# Patient Record
Sex: Female | Born: 1935 | Race: Black or African American | Hispanic: No | Smoking: Never smoker
Health system: Southern US, Community
[De-identification: ages and names within clinical notes are randomized; demographics above are authoritative.]

## PROBLEM LIST (undated history)

## (undated) DIAGNOSIS — M1990 Unspecified osteoarthritis, unspecified site: Secondary | ICD-10-CM

## (undated) DIAGNOSIS — F32A Depression, unspecified: Secondary | ICD-10-CM

## (undated) DIAGNOSIS — K219 Gastro-esophageal reflux disease without esophagitis: Secondary | ICD-10-CM

## (undated) DIAGNOSIS — E785 Hyperlipidemia, unspecified: Secondary | ICD-10-CM

## (undated) DIAGNOSIS — F0280 Dementia in other diseases classified elsewhere without behavioral disturbance: Secondary | ICD-10-CM

## (undated) DIAGNOSIS — F329 Major depressive disorder, single episode, unspecified: Secondary | ICD-10-CM

## (undated) DIAGNOSIS — F419 Anxiety disorder, unspecified: Secondary | ICD-10-CM

## (undated) DIAGNOSIS — I10 Essential (primary) hypertension: Secondary | ICD-10-CM

## (undated) DIAGNOSIS — I499 Cardiac arrhythmia, unspecified: Secondary | ICD-10-CM

## (undated) DIAGNOSIS — R011 Cardiac murmur, unspecified: Secondary | ICD-10-CM

## (undated) DIAGNOSIS — G309 Alzheimer's disease, unspecified: Secondary | ICD-10-CM

## (undated) HISTORY — DX: Cardiac arrhythmia, unspecified: I49.9

## (undated) HISTORY — PX: FOOT FRACTURE SURGERY: SHX645

## (undated) HISTORY — PX: REPLACEMENT TOTAL KNEE: SUR1224

## (undated) HISTORY — DX: Cardiac murmur, unspecified: R01.1

## (undated) HISTORY — DX: Essential (primary) hypertension: I10

## (undated) HISTORY — PX: EYE SURGERY: SHX253

## (undated) HISTORY — DX: Hyperlipidemia, unspecified: E78.5

## (undated) HISTORY — PX: HIP SURGERY: SHX245

## (undated) HISTORY — DX: Gastro-esophageal reflux disease without esophagitis: K21.9

## (undated) HISTORY — DX: Depression, unspecified: F32.A

## (undated) HISTORY — PX: SHOULDER ARTHROSCOPY: SHX128

## (undated) HISTORY — DX: Major depressive disorder, single episode, unspecified: F32.9

## (undated) HISTORY — DX: Anxiety disorder, unspecified: F41.9

## (undated) HISTORY — PX: JOINT REPLACEMENT: SHX530

## (undated) HISTORY — DX: Unspecified osteoarthritis, unspecified site: M19.90

## (undated) HISTORY — PX: FRACTURE SURGERY: SHX138

---

## 1998-12-23 ENCOUNTER — Other Ambulatory Visit: Admission: RE | Admit: 1998-12-23 | Discharge: 1998-12-23 | Payer: Self-pay | Admitting: Family Medicine

## 1999-01-11 ENCOUNTER — Encounter: Payer: Self-pay | Admitting: Family Medicine

## 1999-01-11 ENCOUNTER — Ambulatory Visit (HOSPITAL_COMMUNITY): Admission: RE | Admit: 1999-01-11 | Discharge: 1999-01-11 | Payer: Self-pay | Admitting: Family Medicine

## 2000-01-13 ENCOUNTER — Ambulatory Visit (HOSPITAL_COMMUNITY): Admission: RE | Admit: 2000-01-13 | Discharge: 2000-01-13 | Payer: Self-pay | Admitting: *Deleted

## 2000-02-15 ENCOUNTER — Other Ambulatory Visit: Admission: RE | Admit: 2000-02-15 | Discharge: 2000-02-15 | Payer: Self-pay | Admitting: Family Medicine

## 2000-10-08 ENCOUNTER — Encounter: Admission: RE | Admit: 2000-10-08 | Discharge: 2000-10-08 | Payer: Self-pay | Admitting: Family Medicine

## 2000-10-08 ENCOUNTER — Encounter: Payer: Self-pay | Admitting: Family Medicine

## 2001-03-21 ENCOUNTER — Emergency Department (HOSPITAL_COMMUNITY): Admission: EM | Admit: 2001-03-21 | Discharge: 2001-03-21 | Payer: Self-pay | Admitting: Emergency Medicine

## 2001-04-22 ENCOUNTER — Encounter: Admission: RE | Admit: 2001-04-22 | Discharge: 2001-04-22 | Payer: Self-pay | Admitting: Otolaryngology

## 2001-04-22 ENCOUNTER — Encounter: Payer: Self-pay | Admitting: Otolaryngology

## 2001-05-21 ENCOUNTER — Ambulatory Visit (HOSPITAL_BASED_OUTPATIENT_CLINIC_OR_DEPARTMENT_OTHER): Admission: RE | Admit: 2001-05-21 | Discharge: 2001-05-21 | Payer: Self-pay | Admitting: Otolaryngology

## 2001-08-27 ENCOUNTER — Other Ambulatory Visit: Admission: RE | Admit: 2001-08-27 | Discharge: 2001-08-27 | Payer: Self-pay | Admitting: Family Medicine

## 2001-10-14 ENCOUNTER — Ambulatory Visit (HOSPITAL_COMMUNITY): Admission: RE | Admit: 2001-10-14 | Discharge: 2001-10-14 | Payer: Self-pay | Admitting: Family Medicine

## 2002-10-16 ENCOUNTER — Emergency Department (HOSPITAL_COMMUNITY): Admission: EM | Admit: 2002-10-16 | Discharge: 2002-10-16 | Payer: Self-pay | Admitting: Emergency Medicine

## 2002-12-30 ENCOUNTER — Ambulatory Visit (HOSPITAL_COMMUNITY): Admission: RE | Admit: 2002-12-30 | Discharge: 2002-12-30 | Payer: Self-pay | Admitting: Family Medicine

## 2002-12-30 ENCOUNTER — Encounter: Payer: Self-pay | Admitting: Family Medicine

## 2003-01-21 ENCOUNTER — Ambulatory Visit (HOSPITAL_COMMUNITY): Admission: RE | Admit: 2003-01-21 | Discharge: 2003-01-21 | Payer: Self-pay | Admitting: Family Medicine

## 2003-01-21 ENCOUNTER — Encounter: Payer: Self-pay | Admitting: Family Medicine

## 2003-02-27 ENCOUNTER — Encounter: Payer: Self-pay | Admitting: Family Medicine

## 2003-02-27 ENCOUNTER — Ambulatory Visit (HOSPITAL_COMMUNITY): Admission: RE | Admit: 2003-02-27 | Discharge: 2003-02-27 | Payer: Self-pay | Admitting: Family Medicine

## 2003-04-02 ENCOUNTER — Encounter: Payer: Self-pay | Admitting: Orthopedic Surgery

## 2003-04-02 ENCOUNTER — Encounter: Payer: Self-pay | Admitting: Radiology

## 2003-04-02 ENCOUNTER — Encounter: Admission: RE | Admit: 2003-04-02 | Discharge: 2003-04-02 | Payer: Self-pay | Admitting: Orthopedic Surgery

## 2003-04-15 ENCOUNTER — Encounter: Payer: Self-pay | Admitting: Orthopedic Surgery

## 2003-04-15 ENCOUNTER — Encounter: Admission: RE | Admit: 2003-04-15 | Discharge: 2003-04-15 | Payer: Self-pay | Admitting: Orthopedic Surgery

## 2003-07-03 ENCOUNTER — Ambulatory Visit (HOSPITAL_COMMUNITY): Admission: RE | Admit: 2003-07-03 | Discharge: 2003-07-03 | Payer: Self-pay | Admitting: Orthopedic Surgery

## 2003-07-18 ENCOUNTER — Emergency Department (HOSPITAL_COMMUNITY): Admission: AD | Admit: 2003-07-18 | Discharge: 2003-07-18 | Payer: Self-pay | Admitting: Family Medicine

## 2003-09-14 ENCOUNTER — Emergency Department (HOSPITAL_COMMUNITY): Admission: EM | Admit: 2003-09-14 | Discharge: 2003-09-14 | Payer: Self-pay | Admitting: Family Medicine

## 2003-10-15 ENCOUNTER — Ambulatory Visit (HOSPITAL_COMMUNITY): Admission: RE | Admit: 2003-10-15 | Discharge: 2003-10-15 | Payer: Self-pay | Admitting: Family Medicine

## 2005-11-08 ENCOUNTER — Ambulatory Visit: Payer: Self-pay | Admitting: Family Medicine

## 2005-11-22 ENCOUNTER — Encounter: Admission: RE | Admit: 2005-11-22 | Discharge: 2005-11-22 | Payer: Self-pay | Admitting: Orthopedic Surgery

## 2005-11-28 ENCOUNTER — Encounter: Admission: RE | Admit: 2005-11-28 | Discharge: 2005-11-28 | Payer: Self-pay | Admitting: Orthopedic Surgery

## 2005-12-20 ENCOUNTER — Ambulatory Visit: Payer: Self-pay | Admitting: Family Medicine

## 2006-02-14 ENCOUNTER — Ambulatory Visit: Payer: Self-pay | Admitting: Family Medicine

## 2006-04-09 ENCOUNTER — Ambulatory Visit: Payer: Self-pay | Admitting: Family Medicine

## 2006-04-26 ENCOUNTER — Ambulatory Visit (HOSPITAL_COMMUNITY): Admission: RE | Admit: 2006-04-26 | Discharge: 2006-04-26 | Payer: Self-pay | Admitting: Family Medicine

## 2006-05-30 ENCOUNTER — Ambulatory Visit: Payer: Self-pay | Admitting: Internal Medicine

## 2006-08-15 ENCOUNTER — Ambulatory Visit: Payer: Self-pay | Admitting: Family Medicine

## 2006-08-25 ENCOUNTER — Emergency Department (HOSPITAL_COMMUNITY): Admission: EM | Admit: 2006-08-25 | Discharge: 2006-08-25 | Payer: Self-pay | Admitting: Family Medicine

## 2006-09-20 ENCOUNTER — Ambulatory Visit: Payer: Self-pay | Admitting: Family Medicine

## 2006-09-21 ENCOUNTER — Encounter: Admission: RE | Admit: 2006-09-21 | Discharge: 2006-09-21 | Payer: Self-pay | Admitting: Family Medicine

## 2006-11-23 ENCOUNTER — Encounter: Admission: RE | Admit: 2006-11-23 | Discharge: 2006-11-23 | Payer: Self-pay | Admitting: Family Medicine

## 2007-01-25 ENCOUNTER — Ambulatory Visit: Payer: Self-pay | Admitting: Family Medicine

## 2007-02-05 ENCOUNTER — Ambulatory Visit (HOSPITAL_COMMUNITY): Admission: RE | Admit: 2007-02-05 | Discharge: 2007-02-05 | Payer: Self-pay | Admitting: Family Medicine

## 2007-03-06 ENCOUNTER — Ambulatory Visit: Payer: Self-pay | Admitting: Family Medicine

## 2007-03-15 ENCOUNTER — Ambulatory Visit: Payer: Self-pay | Admitting: Cardiovascular Disease

## 2007-03-19 ENCOUNTER — Inpatient Hospital Stay (HOSPITAL_COMMUNITY): Admission: RE | Admit: 2007-03-19 | Discharge: 2007-03-23 | Payer: Self-pay | Admitting: Specialist

## 2007-05-24 ENCOUNTER — Ambulatory Visit: Payer: Self-pay | Admitting: Internal Medicine

## 2007-05-24 ENCOUNTER — Ambulatory Visit (HOSPITAL_COMMUNITY): Admission: RE | Admit: 2007-05-24 | Discharge: 2007-05-24 | Payer: Self-pay | Admitting: Family Medicine

## 2007-09-02 ENCOUNTER — Ambulatory Visit: Payer: Self-pay | Admitting: Family Medicine

## 2007-09-02 LAB — CONVERTED CEMR LAB
BUN: 26 mg/dL — ABNORMAL HIGH (ref 6–23)
Basophils Absolute: 0 10*3/uL (ref 0.0–0.1)
Basophils Relative: 0 % (ref 0–1)
Chloride: 100 meq/L (ref 96–112)
Creatinine, Ser: 1.27 mg/dL — ABNORMAL HIGH (ref 0.40–1.20)
Eosinophils Absolute: 0.2 10*3/uL (ref 0.0–0.7)
Eosinophils Relative: 2 % (ref 0–5)
Free T4: 1.22 ng/dL (ref 0.89–1.80)
Lymphocytes Relative: 22 % (ref 12–46)
MCHC: 33.8 g/dL (ref 30.0–36.0)
Monocytes Absolute: 0.6 10*3/uL (ref 0.1–1.0)
Platelets: 168 10*3/uL (ref 150–400)
Sodium: 139 meq/L (ref 135–145)

## 2007-09-13 ENCOUNTER — Ambulatory Visit (HOSPITAL_COMMUNITY): Admission: RE | Admit: 2007-09-13 | Discharge: 2007-09-13 | Payer: Self-pay | Admitting: Family Medicine

## 2007-09-19 ENCOUNTER — Emergency Department (HOSPITAL_COMMUNITY): Admission: EM | Admit: 2007-09-19 | Discharge: 2007-09-20 | Payer: Self-pay | Admitting: Emergency Medicine

## 2007-09-24 ENCOUNTER — Ambulatory Visit: Payer: Self-pay | Admitting: Family Medicine

## 2007-10-15 ENCOUNTER — Encounter (INDEPENDENT_AMBULATORY_CARE_PROVIDER_SITE_OTHER): Payer: Self-pay | Admitting: Family Medicine

## 2007-10-15 ENCOUNTER — Ambulatory Visit: Payer: Self-pay | Admitting: Internal Medicine

## 2007-10-15 LAB — CONVERTED CEMR LAB
BUN: 30 mg/dL — ABNORMAL HIGH
CO2: 28 meq/L
Calcium: 9.9 mg/dL
Chloride: 102 meq/L
Creatinine, Ser: 1.67 mg/dL — ABNORMAL HIGH
Glucose, Bld: 90 mg/dL
Potassium: 3.2 meq/L — ABNORMAL LOW
Sodium: 140 meq/L

## 2007-11-01 ENCOUNTER — Ambulatory Visit: Payer: Self-pay | Admitting: Internal Medicine

## 2007-11-01 ENCOUNTER — Encounter (INDEPENDENT_AMBULATORY_CARE_PROVIDER_SITE_OTHER): Payer: Self-pay | Admitting: Family Medicine

## 2007-11-01 LAB — CONVERTED CEMR LAB
Calcium: 9.9 mg/dL (ref 8.4–10.5)
Chloride: 103 meq/L (ref 96–112)

## 2008-03-12 ENCOUNTER — Emergency Department (HOSPITAL_COMMUNITY): Admission: EM | Admit: 2008-03-12 | Discharge: 2008-03-13 | Payer: Self-pay | Admitting: Emergency Medicine

## 2008-03-17 ENCOUNTER — Ambulatory Visit: Payer: Self-pay | Admitting: Family Medicine

## 2008-03-17 LAB — CONVERTED CEMR LAB: Sodium: 141 meq/L (ref 135–145)

## 2008-03-25 ENCOUNTER — Ambulatory Visit (HOSPITAL_COMMUNITY): Admission: RE | Admit: 2008-03-25 | Discharge: 2008-03-25 | Payer: Self-pay | Admitting: Family Medicine

## 2008-04-06 ENCOUNTER — Ambulatory Visit: Payer: Self-pay | Admitting: Family Medicine

## 2008-04-06 LAB — CONVERTED CEMR LAB
Calcium: 10.5 mg/dL (ref 8.4–10.5)
Glucose, Bld: 106 mg/dL — ABNORMAL HIGH (ref 70–99)
Potassium: 3.3 meq/L — ABNORMAL LOW (ref 3.5–5.3)

## 2008-05-11 ENCOUNTER — Ambulatory Visit: Payer: Self-pay | Admitting: Family Medicine

## 2008-05-25 ENCOUNTER — Ambulatory Visit (HOSPITAL_COMMUNITY): Admission: RE | Admit: 2008-05-25 | Discharge: 2008-05-25 | Payer: Self-pay | Admitting: Family Medicine

## 2008-07-24 ENCOUNTER — Ambulatory Visit: Payer: Self-pay | Admitting: Family Medicine

## 2008-07-24 LAB — CONVERTED CEMR LAB
BUN: 15 mg/dL (ref 6–23)
Basophils Relative: 0 % (ref 0–1)
Calcium: 10 mg/dL (ref 8.4–10.5)
Creatinine, Ser: 1.05 mg/dL (ref 0.40–1.20)
Eosinophils Relative: 1 % (ref 0–5)
Lymphocytes Relative: 25 % (ref 12–46)
MCV: 88.3 fL (ref 78.0–100.0)
Monocytes Relative: 8 % (ref 3–12)
Neutrophils Relative %: 66 % (ref 43–77)
Platelets: 174 10*3/uL (ref 150–400)
Potassium: 3.8 meq/L (ref 3.5–5.3)
RBC: 4.36 M/uL (ref 3.87–5.11)
RDW: 14.7 % (ref 11.5–15.5)
Sodium: 142 meq/L (ref 135–145)
Vit D, 1,25-Dihydroxy: 29 — ABNORMAL LOW (ref 30–89)
WBC: 6.4 10*3/uL (ref 4.0–10.5)

## 2008-08-06 ENCOUNTER — Ambulatory Visit: Payer: Self-pay | Admitting: Family Medicine

## 2008-09-29 ENCOUNTER — Ambulatory Visit (HOSPITAL_COMMUNITY): Admission: RE | Admit: 2008-09-29 | Discharge: 2008-09-30 | Payer: Self-pay | Admitting: Specialist

## 2008-10-28 ENCOUNTER — Emergency Department (HOSPITAL_COMMUNITY): Admission: EM | Admit: 2008-10-28 | Discharge: 2008-10-29 | Payer: Self-pay | Admitting: Emergency Medicine

## 2008-10-29 ENCOUNTER — Encounter (INDEPENDENT_AMBULATORY_CARE_PROVIDER_SITE_OTHER): Payer: Self-pay | Admitting: Emergency Medicine

## 2008-10-29 ENCOUNTER — Ambulatory Visit: Payer: Self-pay | Admitting: Vascular Surgery

## 2008-11-05 ENCOUNTER — Ambulatory Visit: Payer: Self-pay | Admitting: Family Medicine

## 2008-12-01 ENCOUNTER — Emergency Department (HOSPITAL_COMMUNITY): Admission: EM | Admit: 2008-12-01 | Discharge: 2008-12-01 | Payer: Self-pay | Admitting: Emergency Medicine

## 2008-12-04 ENCOUNTER — Encounter: Admission: RE | Admit: 2008-12-04 | Discharge: 2009-01-21 | Payer: Self-pay | Admitting: Specialist

## 2009-02-04 ENCOUNTER — Ambulatory Visit: Payer: Self-pay | Admitting: Family Medicine

## 2009-05-15 ENCOUNTER — Emergency Department (HOSPITAL_COMMUNITY): Admission: EM | Admit: 2009-05-15 | Discharge: 2009-05-15 | Payer: Self-pay | Admitting: Emergency Medicine

## 2009-05-17 ENCOUNTER — Telehealth (INDEPENDENT_AMBULATORY_CARE_PROVIDER_SITE_OTHER): Payer: Self-pay | Admitting: *Deleted

## 2009-05-23 ENCOUNTER — Emergency Department (HOSPITAL_COMMUNITY): Admission: EM | Admit: 2009-05-23 | Discharge: 2009-05-23 | Payer: Self-pay | Admitting: Emergency Medicine

## 2009-05-24 ENCOUNTER — Ambulatory Visit: Payer: Self-pay | Admitting: Family Medicine

## 2009-05-24 ENCOUNTER — Encounter (INDEPENDENT_AMBULATORY_CARE_PROVIDER_SITE_OTHER): Payer: Self-pay | Admitting: Family Medicine

## 2009-05-24 LAB — CONVERTED CEMR LAB: TSH: 1.093 microintl units/mL (ref 0.350–4.500)

## 2009-05-27 ENCOUNTER — Ambulatory Visit (HOSPITAL_COMMUNITY): Admission: RE | Admit: 2009-05-27 | Discharge: 2009-05-27 | Payer: Self-pay | Admitting: Family Medicine

## 2009-06-14 IMAGING — CR DG CHEST 2V
2 series · 2 of 2 positions shown · non-contrast
Comparison: none

CLINICAL DATA: Difficulty breathing.  Complains of trouble swallowing.  Hypertension.
 CHEST - 2 VIEW - 02/05/07: 
 There are no prior studies currently available for comparison.

[w chest pa]
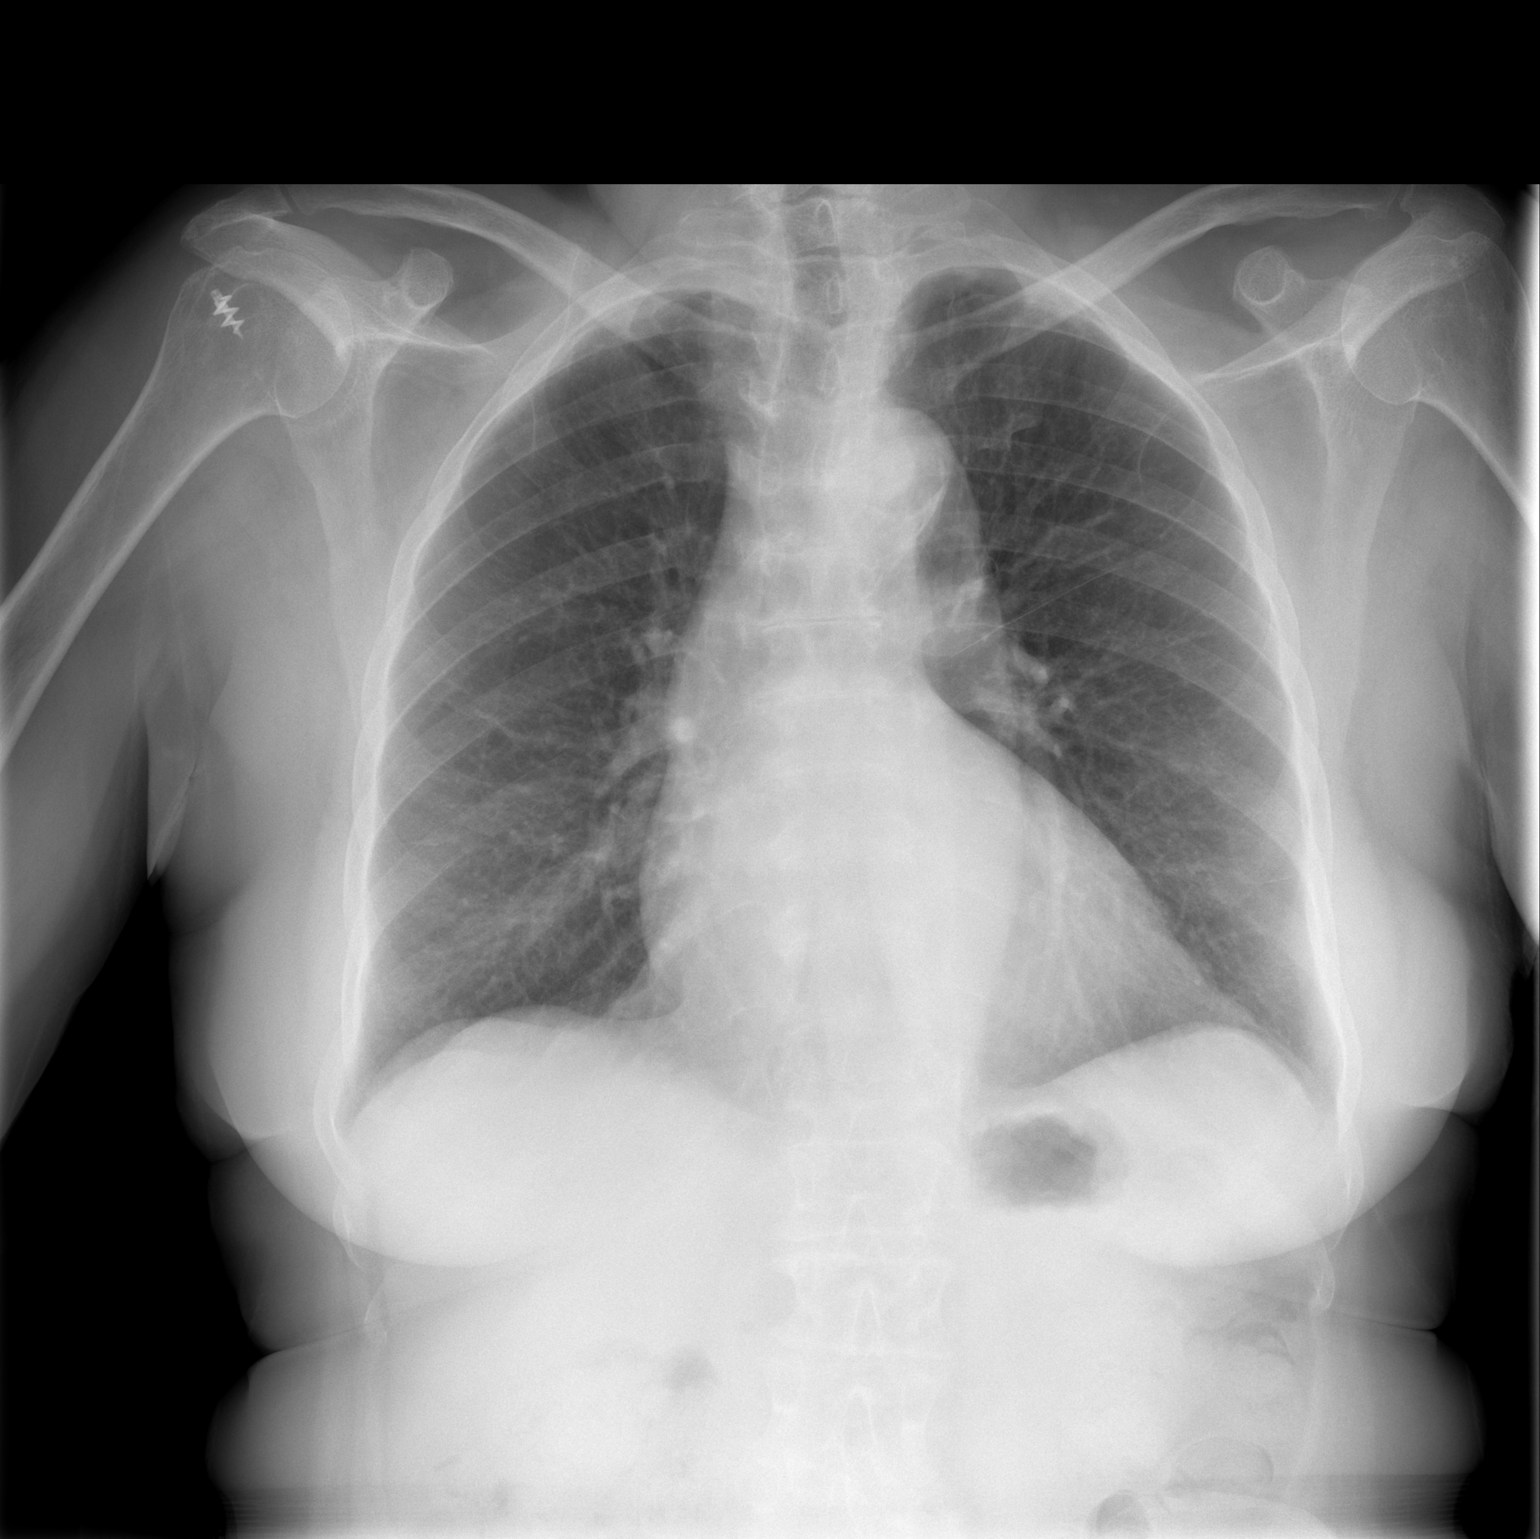

[w chest lat]
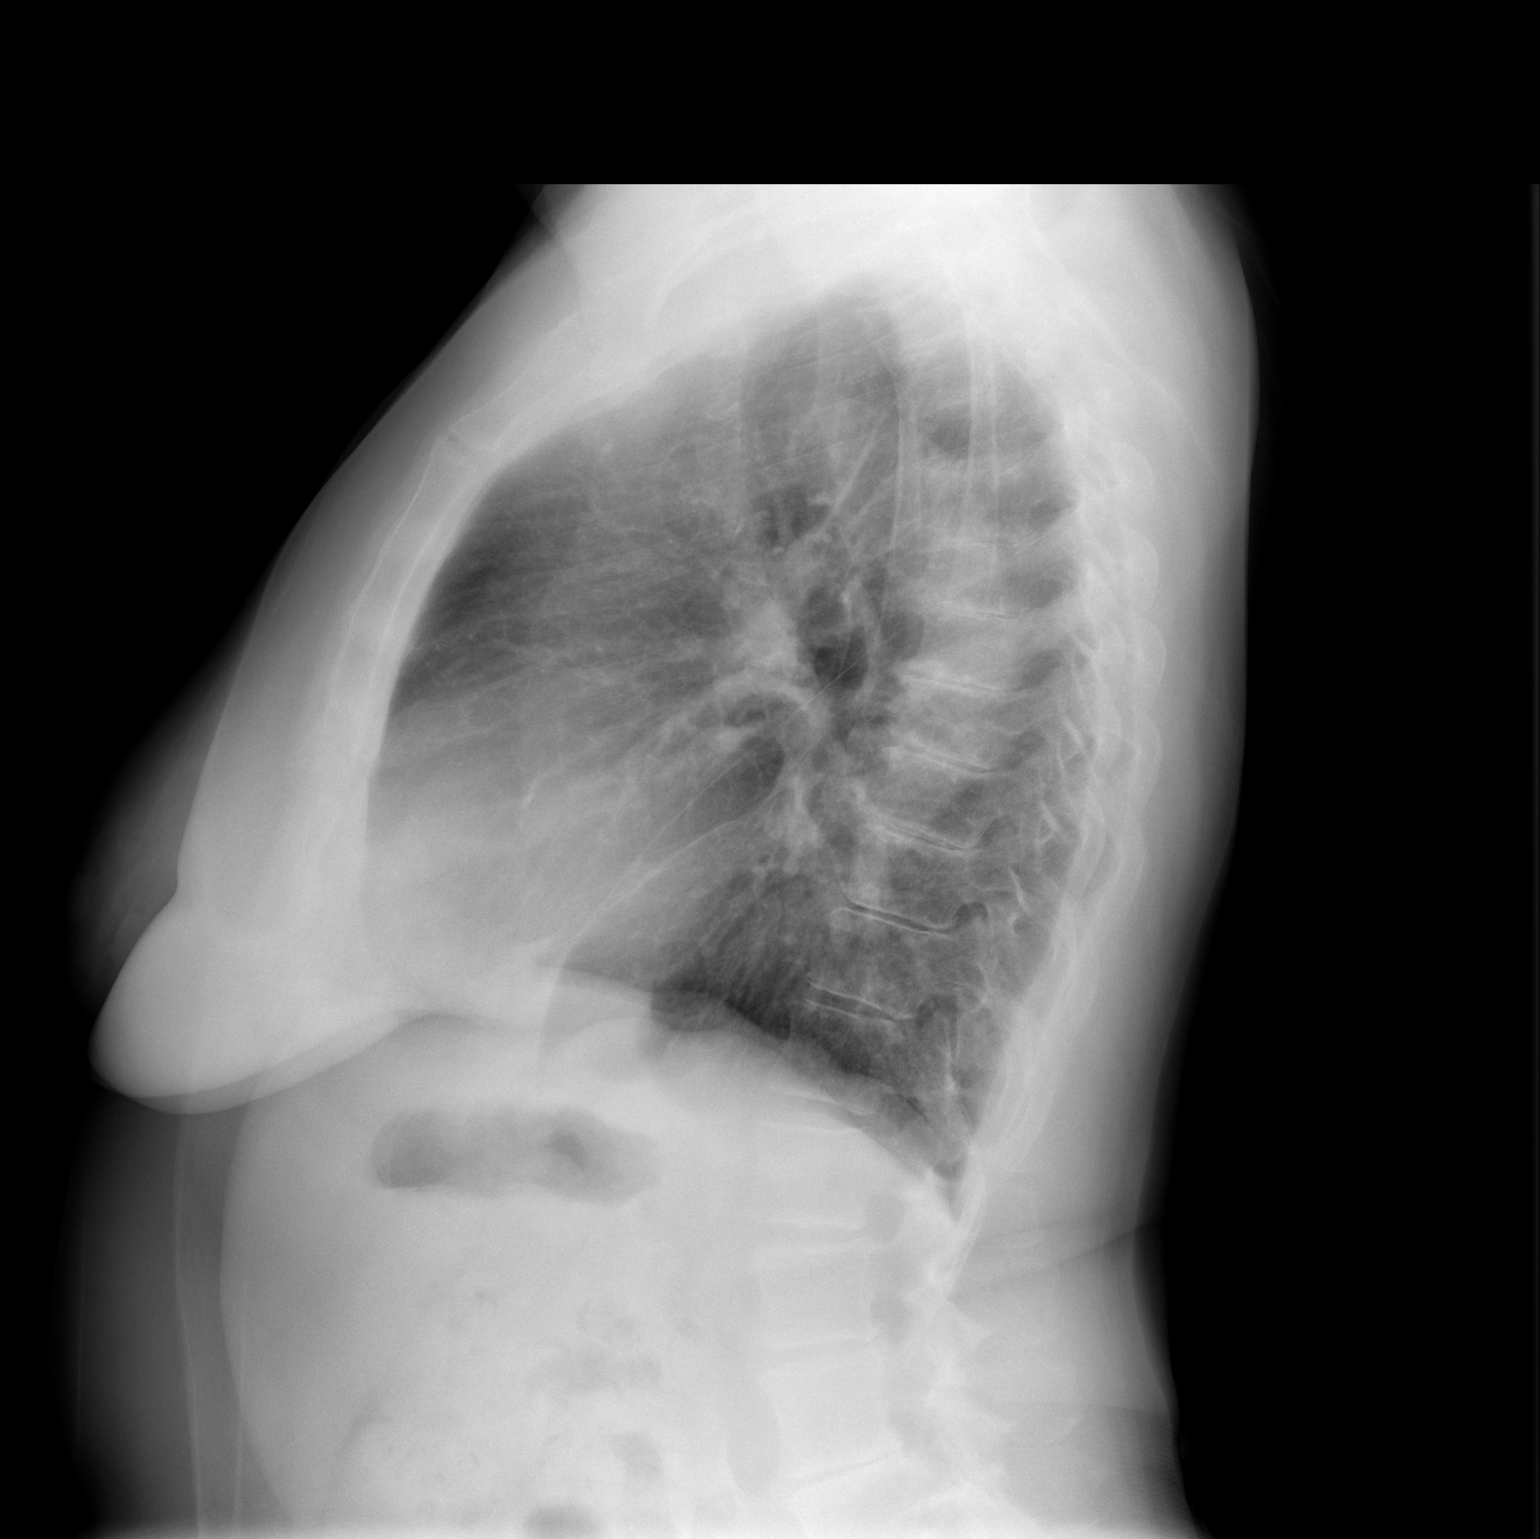

[2 of 2 positions shown; findings below may reference images not displayed]

FINDINGS: The lungs are clear.   The heart is borderline in size.  There are no mediastinal or hilar abnormalities.
IMPRESSION: Borderline cardiac size.  No evidence for active chest disease.

## 2009-07-01 ENCOUNTER — Ambulatory Visit: Payer: Self-pay | Admitting: Family Medicine

## 2009-07-01 LAB — CONVERTED CEMR LAB
AST: 15 units/L (ref 0–37)
Albumin: 3.9 g/dL (ref 3.5–5.2)
Alkaline Phosphatase: 77 units/L (ref 39–117)
Glucose, Bld: 59 mg/dL — ABNORMAL LOW (ref 70–99)
LDL Cholesterol: 82 mg/dL (ref 0–99)
Potassium: 3.7 meq/L (ref 3.5–5.3)
Sodium: 143 meq/L (ref 135–145)
Total Protein: 7.2 g/dL (ref 6.0–8.3)

## 2009-08-26 ENCOUNTER — Ambulatory Visit: Payer: Self-pay | Admitting: Family Medicine

## 2009-10-28 ENCOUNTER — Encounter: Payer: Self-pay | Admitting: Cardiovascular Disease

## 2009-12-02 ENCOUNTER — Encounter: Payer: Self-pay | Admitting: Cardiovascular Disease

## 2009-12-02 ENCOUNTER — Ambulatory Visit: Payer: Self-pay | Admitting: Family Medicine

## 2009-12-16 ENCOUNTER — Encounter (INDEPENDENT_AMBULATORY_CARE_PROVIDER_SITE_OTHER): Payer: Self-pay | Admitting: *Deleted

## 2009-12-17 ENCOUNTER — Encounter: Payer: Self-pay | Admitting: Cardiovascular Disease

## 2009-12-21 ENCOUNTER — Encounter: Payer: Self-pay | Admitting: Cardiovascular Disease

## 2009-12-30 ENCOUNTER — Ambulatory Visit: Payer: Self-pay | Admitting: Cardiovascular Disease

## 2009-12-30 DIAGNOSIS — R9431 Abnormal electrocardiogram [ECG] [EKG]: Secondary | ICD-10-CM

## 2009-12-30 DIAGNOSIS — I10 Essential (primary) hypertension: Secondary | ICD-10-CM | POA: Insufficient documentation

## 2009-12-30 DIAGNOSIS — E782 Mixed hyperlipidemia: Secondary | ICD-10-CM | POA: Insufficient documentation

## 2010-01-25 ENCOUNTER — Inpatient Hospital Stay (HOSPITAL_COMMUNITY): Admission: RE | Admit: 2010-01-25 | Discharge: 2010-01-29 | Payer: Self-pay | Admitting: Specialist

## 2010-03-12 ENCOUNTER — Emergency Department (HOSPITAL_COMMUNITY): Admission: EM | Admit: 2010-03-12 | Discharge: 2010-03-12 | Payer: Self-pay | Admitting: Family Medicine

## 2010-03-12 ENCOUNTER — Emergency Department (HOSPITAL_COMMUNITY): Admission: EM | Admit: 2010-03-12 | Discharge: 2010-03-12 | Payer: Self-pay | Admitting: Emergency Medicine

## 2010-08-16 NOTE — Consult Note (Signed)
Summary: [facility] Pre- Operative Consultation  [facility] Pre- Operative Consultation   Imported By: Earl Many 12/29/2009 11:10:27  _____________________________________________________________________  External Attachment:    Type:   Image     Comment:   External Document

## 2010-08-16 NOTE — Letter (Signed)
Summary: Appointment - Reschedule  [facility], Main Office  [address]   [place], [place] [address]   Phone: [phone]  Fax: [phone]     December 16, 2009 MRN: [id number]   Brenda Werner [address] [place], [place]  [address]   Dear Ms. Hoelzer,   Due to a change in our office schedule, your appointment on 12/21/2009 at  8:45am must be changed.  It is very important that we reach you to reschedule this appointment. We look forward to participating in your health care needs. Please contact us at the number listed above at your earliest convenience to reschedule this appointment.     Sincerely,   Migdalia Dk [facility] Scheduling Team

## 2010-08-16 NOTE — Assessment & Plan Note (Signed)
Summary: f2y/surgical clearance/jml   History of Present Illness: Elderly female with HTN, elevated lipids who needs preop clearance for orthopedic surgery.  Has had multiple previous ortho procedures with no complications.  Previous ECG with low atrial focus and nonspecific ST/ Twave changes.  No sscp, palpitations, dyspnea, edema or syncope.  Compliant with medications.  No chronic lung disease, blleding diathesis or previous problems with anesthesia.  BP has been under good control and LDL under 100.  No previously documented CAD, valvular disease, CHF or arrhythmia.  Current Problems (verified): 1)  Mixed Hyperlipidemia  (ICD-272.2) 2)  Essential Hypertension, Benign  (ICD-401.1) 3)  Electrocardiogram, Abnormal  (ICD-794.31) 4)  Preoperative Examination  (ICD-V72.84)  Current Medications (verified): 1)  Paxil 20 Mg Tabs (Paroxetine Hcl) .Marland Kitchen.. 1 Tab By Mouth Once Daily 2)  Allergy Relief 10 Mg Tabs (Loratadine) .... As Needed 3)  Gabapentin 100 Mg Caps (Gabapentin) .Marland Kitchen.. 1 Tab By Mouth Three Times A Day 4)  Etodolac 400 Mg Tabs (Etodolac) .Marland Kitchen.. 1 Tab By Mouth Two Times A Day With Food As Needed 5)  Aspirin 81 Mg Tbec (Aspirin) .... Take One Tablet By Mouth Daily 6)  Klor-Con 10 10 Meq Cr-Tabs (Potassium Chloride) .Marland Kitchen.. 1 Tab By Mouth Once Daily 7)  Triamterene-Hctz 75-50 Mg Tabs (Triamterene-Hctz) .Marland Kitchen.. 1 Tab By Mouth Once Daily 8)  Tylenol Arthritis Pain 650 Mg Cr-Tabs (Acetaminophen) .... As Needed 9)  Lipitor 20 Mg Tabs (Atorvastatin Calcium) .... Take One Tablet By Mouth Daily. 10)  Cartia Xt 240 Mg Xr24h-Cap (Diltiazem Hcl Coated Beads) .Marland Kitchen.. 1 Tab By Mouth Once Daily  Allergies (verified): No Known Drug Allergies  Past History:  Past Medical History: Last updated: 12/17/2009 The patient has a low atrial focus. hypertension arthritis  heart disease depression  Mild lower extremity edema Hypercholesterolemia.   Past Surgical History: Last updated: 12/17/2009 Traumatic left  ankle injury requiring surgery back in the 1980s.  Family History: Last updated: 12/17/2009  Noncontributory.  Social History: Last updated: 12/30/2009 Tobacco Use - No.  ETOH no Sedentary due to multiple ortho issues Ambulates with cane Does all ADL's but doesnt drive Lives with daughter  Social History: Tobacco Use - No.  ETOH no Sedentary due to multiple ortho issues Ambulates with cane Does all ADL's but doesnt drive Lives with daughter  Review of Systems       Denies fever, malais, weight loss, blurry vision, decreased visual acuity, cough, sputum, SOB, hemoptysis, pleuritic pain, palpitaitons, heartburn, abdominal pain, melena, lower extremity edema, claudication, or rash.   Vital Signs:  Patient profile:   77 year old female Pulse rate:   81 / minute Resp:     12 per minute BP sitting:   142 / 80  (left arm)  Vitals Entered By: Kem Parkinson (December 30, 2009 9:02 AM)  Physical Exam  General:  Affect appropriate Healthy:  appears stated age HEENT: normal Neck supple with no adenopathy JVP normal no bruits no thyromegaly Lungs clear with no wheezing and good diaphragmatic motion Heart:  S1/S2 no murmur,rub, gallop or click PMI normal Abdomen: benighn, BS positve, no tenderness, no AAA no bruit.  No HSM or HJR Distal pulses intact with no bruits No edema Neuro non-focal Skin warm and dry S/P right shoulder surgery S/P LLE surgery S/P left hip surgery   Impression & Recommendations:  Problem # 1:  PREOPERATIVE EXAMINATION (ICD-V72.84) No symptoms with no complications on multiple previous orthopedic procedures.  Nonsmoker and non diabetic.  Ok to have surgery without  further testing  Problem # 2:  ELECTROCARDIOGRAM, ABNORMAL (ICD-794.31) ECG today is low risk.  Previous low atrial focus seen in 2008 gone.  QT 392 No acute ST/T wave changes and no previous MI Her updated medication list for this problem includes:    Aspirin 81 Mg Tbec (Aspirin)  .Marland Kitchen... Take one tablet by mouth daily    Cartia Xt 240 Mg Xr24h-cap (Diltiazem hcl coated beads) .Marland Kitchen... 1 tab by mouth once daily  Problem # 3:  MIXED HYPERLIPIDEMIA (ICD-272.2) At goal with no side effects Her updated medication list for this problem includes:    Lipitor 20 Mg Tabs (Atorvastatin calcium) .Marland Kitchen... Take one tablet by mouth daily.  CHOL: 178 (07/01/2009)   LDL: 82 (07/01/2009)   HDL: 80 (07/01/2009)   TG: 78 (07/01/2009)  Problem # 4:  ESSENTIAL HYPERTENSION, BENIGN (ICD-401.1) Continue current medication.  Consider changing over to ACE Her updated medication list for this problem includes:    Aspirin 81 Mg Tbec (Aspirin) .Marland Kitchen... Take one tablet by mouth daily    Triamterene-hctz 75-50 Mg Tabs (Triamterene-hctz) .Marland Kitchen... 1 tab by mouth once daily    Cartia Xt 240 Mg Xr24h-cap (Diltiazem hcl coated beads) .Marland Kitchen... 1 tab by mouth once daily  Patient Instructions: 1)  Your physician recommends that you schedule a follow-up appointment in: as needed with Dr. Eden Emms 2)  Your physician recommends that you continue on your current medications as directed. Please refer to the Current Medication list given to you today.   EKG Report  Procedure date:  12/30/2009  Findings:      NSR 80 Low voltage QRS Borderline ECG

## 2010-08-16 NOTE — Consult Note (Signed)
Summary: [facility]   Imported By: Earl Many 12/29/2009 11:12:40  _____________________________________________________________________  External Attachment:    Type:   Image     Comment:   External Document

## 2010-08-16 NOTE — Consult Note (Signed)
Summary: [facility] Pre Op Consult   [facility] Pre Op Consult   Imported By: Roderic Ovens 02/14/2010 11:05:17  _____________________________________________________________________  External Attachment:    Type:   Image     Comment:   External Document

## 2010-09-06 ENCOUNTER — Encounter (INDEPENDENT_AMBULATORY_CARE_PROVIDER_SITE_OTHER): Payer: Self-pay | Admitting: Family Medicine

## 2010-09-06 LAB — CONVERTED CEMR LAB
ALT: 8 units/L (ref 0–35)
CO2: 28 meq/L (ref 19–32)
Calcium: 10.4 mg/dL (ref 8.4–10.5)
Chloride: 99 meq/L (ref 96–112)
Cholesterol: 179 mg/dL (ref 0–200)
Potassium: 3.9 meq/L (ref 3.5–5.3)
Sodium: 140 meq/L (ref 135–145)
Total Protein: 7.6 g/dL (ref 6.0–8.3)

## 2010-09-30 LAB — URINALYSIS, ROUTINE W REFLEX MICROSCOPIC
Glucose, UA: NEGATIVE mg/dL
Leukocytes, UA: NEGATIVE
Specific Gravity, Urine: 1.015 (ref 1.005–1.030)
pH: 8.5 — ABNORMAL HIGH (ref 5.0–8.0)

## 2010-09-30 LAB — COMPREHENSIVE METABOLIC PANEL
ALT: 9 U/L (ref 0–35)
Albumin: 3.8 g/dL (ref 3.5–5.2)
Calcium: 10 mg/dL (ref 8.4–10.5)
Glucose, Bld: 116 mg/dL — ABNORMAL HIGH (ref 70–99)
Sodium: 136 mEq/L (ref 135–145)
Total Protein: 7.4 g/dL (ref 6.0–8.3)

## 2010-09-30 LAB — DIFFERENTIAL
Eosinophils Absolute: 0.1 10*3/uL (ref 0.0–0.7)
Lymphs Abs: 1.5 10*3/uL (ref 0.7–4.0)
Monocytes Absolute: 0.5 10*3/uL (ref 0.1–1.0)
Monocytes Relative: 6 % (ref 3–12)
Neutro Abs: 6.1 10*3/uL (ref 1.7–7.7)
Neutrophils Relative %: 74 % (ref 43–77)

## 2010-09-30 LAB — CBC
Hemoglobin: 12.7 g/dL (ref 12.0–15.0)
MCH: 31.9 pg (ref 26.0–34.0)
MCV: 90.2 fL (ref 78.0–100.0)
RBC: 3.98 MIL/uL (ref 3.87–5.11)

## 2010-09-30 LAB — URINE MICROSCOPIC-ADD ON

## 2010-09-30 LAB — GLUCOSE, CAPILLARY: Glucose-Capillary: 102 mg/dL — ABNORMAL HIGH (ref 70–99)

## 2010-10-01 LAB — BASIC METABOLIC PANEL
BUN: 24 mg/dL — ABNORMAL HIGH (ref 6–23)
Calcium: 8.8 mg/dL (ref 8.4–10.5)
GFR calc non Af Amer: 26 mL/min — ABNORMAL LOW (ref >60)
Glucose, Bld: 110 mg/dL — ABNORMAL HIGH (ref 70–99)
Potassium: 3.6 mEq/L (ref 3.5–5.1)

## 2010-10-01 LAB — CBC
HCT: 24.3 % — ABNORMAL LOW (ref 36.0–46.0)
MCHC: 34.3 g/dL (ref 30.0–36.0)
RDW: 13.1 % (ref 11.5–15.5)

## 2010-10-01 LAB — HEMOGLOBIN AND HEMATOCRIT, BLOOD: HCT: 24.2 % — ABNORMAL LOW (ref 36.0–46.0)

## 2010-10-01 LAB — PROTIME-INR: INR: 1.49 (ref 0.00–1.49)

## 2010-10-02 LAB — URINALYSIS, ROUTINE W REFLEX MICROSCOPIC
Hgb urine dipstick: NEGATIVE
Ketones, ur: NEGATIVE mg/dL
Ketones, ur: NEGATIVE mg/dL
Nitrite: NEGATIVE
Nitrite: NEGATIVE
Protein, ur: NEGATIVE mg/dL
Urobilinogen, UA: 1 mg/dL (ref 0.0–1.0)
Urobilinogen, UA: 1 mg/dL (ref 0.0–1.0)
pH: 6 (ref 5.0–8.0)

## 2010-10-02 LAB — SURGICAL PCR SCREEN: MRSA, PCR: NEGATIVE

## 2010-10-02 LAB — COMPREHENSIVE METABOLIC PANEL
ALT: 12 U/L (ref 0–35)
Alkaline Phosphatase: 76 U/L (ref 39–117)
CO2: 29 mEq/L (ref 19–32)
Chloride: 101 mEq/L (ref 96–112)
GFR calc non Af Amer: 40 mL/min — ABNORMAL LOW (ref >60)
Glucose, Bld: 96 mg/dL (ref 70–99)
Potassium: 3 mEq/L — ABNORMAL LOW (ref 3.5–5.1)
Sodium: 140 mEq/L (ref 135–145)
Total Bilirubin: 0.5 mg/dL (ref 0.3–1.2)

## 2010-10-02 LAB — CBC
HCT: 28.9 % — ABNORMAL LOW (ref 36.0–46.0)
HCT: 39.3 % (ref 36.0–46.0)
Hemoglobin: 13.3 g/dL (ref 12.0–15.0)
MCHC: 33.8 g/dL (ref 30.0–36.0)
MCHC: 34.1 g/dL (ref 30.0–36.0)
MCHC: 34.2 g/dL (ref 30.0–36.0)
Platelets: 102 10*3/uL — ABNORMAL LOW (ref 150–400)
Platelets: 106 10*3/uL — ABNORMAL LOW (ref 150–400)
RBC: 4 MIL/uL (ref 3.87–5.11)
RDW: 12.9 % (ref 11.5–15.5)
RDW: 13 % (ref 11.5–15.5)
WBC: 11.3 10*3/uL — ABNORMAL HIGH (ref 4.0–10.5)
WBC: 14 10*3/uL — ABNORMAL HIGH (ref 4.0–10.5)

## 2010-10-02 LAB — BASIC METABOLIC PANEL
BUN: 15 mg/dL (ref 6–23)
CO2: 27 mEq/L (ref 19–32)
Calcium: 9.2 mg/dL (ref 8.4–10.5)
Calcium: 9.6 mg/dL (ref 8.4–10.5)
Chloride: 101 mEq/L (ref 96–112)
Creatinine, Ser: 1.55 mg/dL — ABNORMAL HIGH (ref 0.4–1.2)
GFR calc Af Amer: 39 mL/min — ABNORMAL LOW (ref >60)
GFR calc non Af Amer: 41 mL/min — ABNORMAL LOW (ref >60)
Glucose, Bld: 150 mg/dL — ABNORMAL HIGH (ref 70–99)
Potassium: 3.2 mEq/L — ABNORMAL LOW (ref 3.5–5.1)
Sodium: 138 mEq/L (ref 135–145)

## 2010-10-02 LAB — PROTIME-INR
INR: 0.93 (ref 0.00–1.49)
INR: 1.05 (ref 0.00–1.49)
INR: 1.29 (ref 0.00–1.49)
Prothrombin Time: 12.4 seconds (ref 11.6–15.2)
Prothrombin Time: 13.6 seconds (ref 11.6–15.2)
Prothrombin Time: 16 seconds — ABNORMAL HIGH (ref 11.6–15.2)

## 2010-10-02 LAB — DIFFERENTIAL
Basophils Relative: 0 % (ref 0–1)
Eosinophils Absolute: 0.2 10*3/uL (ref 0.0–0.7)
Eosinophils Relative: 2 % (ref 0–5)
Neutrophils Relative %: 80 % — ABNORMAL HIGH (ref 43–77)

## 2010-10-02 LAB — TYPE AND SCREEN
ABO/RH(D): B POS
Antibody Screen: NEGATIVE

## 2010-10-02 LAB — URINE MICROSCOPIC-ADD ON

## 2010-10-10 ENCOUNTER — Encounter (INDEPENDENT_AMBULATORY_CARE_PROVIDER_SITE_OTHER): Payer: Self-pay | Admitting: *Deleted

## 2010-10-13 ENCOUNTER — Telehealth: Payer: Self-pay | Admitting: Gastroenterology

## 2010-10-13 NOTE — Telephone Encounter (Signed)
I spoke with the pt and she was asking about a past coumadin use and a knee surgery that she had,  She has a previsit soon I told her as long as she is not on coumadin now she is ok to keep appt.

## 2010-10-13 NOTE — Telephone Encounter (Signed)
Left message on machine to call back  

## 2010-10-18 NOTE — Letter (Signed)
Summary: Pre Visit Letter Revised  [facility]  [address] [place], [place] [address]   Phone: [phone]  Fax: [phone]        10/10/2010 MRN: [id number] Brenda Werner [address] [place], [place]  [address]             Procedure Date:  11-21-10           Direct Colon---Dr. Christella Hartigan   Welcome to the Gastroenterology Division at [facility].    You are scheduled to see a nurse for your pre-procedure visit on 11-07-10 at 10:00a.m. on the 3rd floor at [facility], [address]  We ask that you try to arrive at our office 15 minutes prior to your appointment time to allow for check-in.  Please take a minute to review the attached form.  If you answer "Yes" to one or more of the questions on the first page, we ask that you call the person listed at your earliest opportunity.  If you answer "No" to all of the questions, please complete the rest of the form and bring it to your appointment.    Your nurse visit will consist of discussing your medical and surgical history, your immediate family medical history, and your medications.   If you are unable to list all of your medications on the form, please bring the medication bottles to your appointment and we will list them.  We will need to be aware of both prescribed and over the counter drugs.  We will need to know exact dosage information as well.    Please be prepared to read and sign documents such as consent forms, a financial agreement, and acknowledgement forms.  If necessary, and with your consent, a friend or relative is welcome to sit-in on the nurse visit with you.  Please bring your insurance card so that we may make a copy of it.  If your insurance requires a referral to see a specialist, please bring your referral form from your primary care physician.  No co-pay is required for this nurse visit.     If you cannot keep your appointment, please call [phone] to cancel or reschedule  prior to your appointment date.  This allows [place] the opportunity to schedule an appointment for another patient in need of care.    Thank you for choosing [facility] for your medical needs.  We appreciate the opportunity to care for you.  Please visit [place] at our website  to learn more about our practice.  Sincerely, The Gastroenterology Division

## 2010-10-19 LAB — POCT I-STAT, CHEM 8
BUN: 17 mg/dL (ref 6–23)
Chloride: 101 mEq/L (ref 96–112)
HCT: 38 % (ref 36.0–46.0)
Potassium: 3.2 mEq/L — ABNORMAL LOW (ref 3.5–5.1)
Sodium: 140 mEq/L (ref 135–145)

## 2010-10-19 LAB — DIFFERENTIAL
Basophils Absolute: 0 10*3/uL (ref 0.0–0.1)
Basophils Relative: 0 % (ref 0–1)
Lymphocytes Relative: 12 % (ref 12–46)
Neutro Abs: 6.5 10*3/uL (ref 1.7–7.7)
Neutrophils Relative %: 81 % — ABNORMAL HIGH (ref 43–77)

## 2010-10-19 LAB — CBC
Hemoglobin: 13.1 g/dL (ref 12.0–15.0)
MCHC: 34.7 g/dL (ref 30.0–36.0)
Platelets: 178 10*3/uL (ref 150–400)
RDW: 12.5 % (ref 11.5–15.5)

## 2010-10-25 LAB — HEMOCCULT GUIAC POC 1CARD (OFFICE): Fecal Occult Bld: POSITIVE

## 2010-10-26 LAB — POCT I-STAT, CHEM 8
Chloride: 100 mEq/L (ref 96–112)
Glucose, Bld: 112 mg/dL — ABNORMAL HIGH (ref 70–99)
HCT: 35 % — ABNORMAL LOW (ref 36.0–46.0)
Hemoglobin: 11.9 g/dL — ABNORMAL LOW (ref 12.0–15.0)
Potassium: 3.6 mEq/L (ref 3.5–5.1)

## 2010-10-26 LAB — D-DIMER, QUANTITATIVE: D-Dimer, Quant: 1.36 ug/mL-FEU — ABNORMAL HIGH (ref 0.00–0.48)

## 2010-10-27 LAB — URINALYSIS, ROUTINE W REFLEX MICROSCOPIC
Bilirubin Urine: NEGATIVE
Glucose, UA: NEGATIVE mg/dL
Hgb urine dipstick: NEGATIVE
Ketones, ur: NEGATIVE mg/dL
Nitrite: NEGATIVE
Specific Gravity, Urine: 1.014 (ref 1.005–1.030)
pH: 8 (ref 5.0–8.0)

## 2010-10-27 LAB — DIFFERENTIAL
Basophils Absolute: 0 10*3/uL (ref 0.0–0.1)
Basophils Relative: 0 % (ref 0–1)
Eosinophils Absolute: 0.2 10*3/uL (ref 0.0–0.7)
Eosinophils Relative: 2 % (ref 0–5)
Monocytes Absolute: 0.5 10*3/uL (ref 0.1–1.0)
Monocytes Relative: 6 % (ref 3–12)

## 2010-10-27 LAB — COMPREHENSIVE METABOLIC PANEL
ALT: 12 U/L (ref 0–35)
Albumin: 4.1 g/dL (ref 3.5–5.2)
Alkaline Phosphatase: 84 U/L (ref 39–117)
BUN: 13 mg/dL (ref 6–23)
Chloride: 98 mEq/L (ref 96–112)
Potassium: 3.5 mEq/L (ref 3.5–5.1)
Sodium: 139 mEq/L (ref 135–145)
Total Bilirubin: 0.6 mg/dL (ref 0.3–1.2)
Total Protein: 7.6 g/dL (ref 6.0–8.3)

## 2010-10-27 LAB — URINE MICROSCOPIC-ADD ON

## 2010-10-27 LAB — CBC
HCT: 39.5 % (ref 36.0–46.0)
Hemoglobin: 13.6 g/dL (ref 12.0–15.0)
Platelets: 155 10*3/uL (ref 150–400)
RDW: 14.4 % (ref 11.5–15.5)
WBC: 8.9 10*3/uL (ref 4.0–10.5)

## 2010-11-07 ENCOUNTER — Ambulatory Visit (AMBULATORY_SURGERY_CENTER): Payer: Medicare Other | Admitting: *Deleted

## 2010-11-07 VITALS — Ht 63.0 in | Wt 147.0 lb

## 2010-11-07 DIAGNOSIS — Z1211 Encounter for screening for malignant neoplasm of colon: Secondary | ICD-10-CM

## 2010-11-07 MED ORDER — PEG-KCL-NACL-NASULF-NA ASC-C 100 G PO SOLR: ORAL | Status: DC

## 2010-11-21 ENCOUNTER — Ambulatory Visit (AMBULATORY_SURGERY_CENTER): Payer: Medicare Other | Admitting: Gastroenterology

## 2010-11-21 ENCOUNTER — Encounter: Payer: Self-pay | Admitting: Gastroenterology

## 2010-11-21 DIAGNOSIS — K648 Other hemorrhoids: Secondary | ICD-10-CM

## 2010-11-21 DIAGNOSIS — K5730 Diverticulosis of large intestine without perforation or abscess without bleeding: Secondary | ICD-10-CM

## 2010-11-21 DIAGNOSIS — D126 Benign neoplasm of colon, unspecified: Secondary | ICD-10-CM

## 2010-11-21 DIAGNOSIS — Z1211 Encounter for screening for malignant neoplasm of colon: Secondary | ICD-10-CM

## 2010-11-21 MED ORDER — SODIUM CHLORIDE 0.9 % IV SOLN: 500.0000 mL | INTRAVENOUS | Status: DC

## 2010-11-21 NOTE — Patient Instructions (Signed)
Discharged instructions given with verbal understanding. Handouts on polyps, diverticulosis, and hemorrhoids given. Resume previous medications. 

## 2010-11-22 ENCOUNTER — Telehealth: Payer: Self-pay | Admitting: *Deleted

## 2010-11-22 NOTE — Telephone Encounter (Signed)

## 2010-11-29 NOTE — Op Note (Signed)
NAMEBRILEE, PORT NO.:  [id number]   MEDICAL RECORD NO.:  [id number]          PATIENT TYPE:  INP   LOCATION:  5021                         FACILITY:  [facility]   PHYSICIAN:  Kerrin Champagne, M.D.   DATE OF BIRTH:  02/20/1930   DATE OF PROCEDURE:  09/29/2008  DATE OF DISCHARGE:                               OPERATIVE REPORT   PREOPERATIVE DIAGNOSIS:  Right shoulder recurrent rotator cuff tear.   POSTOPERATIVE DIAGNOSIS:  Recurrent right shoulder rotator cuff tear  involving supraspinatus with impingement over the lateral aspect of the  acromion process and avulsed biceps tendon with the residual stump  within the shoulder prominent.   PROCEDURE:  Redo right shoulder acromioplasty of the anterolateral  acromion with repeat rotator cuff repair using suture trough with 2  Mitek anchors and debridement of biceps tendon avulsed from the superior  portion of the labrum and its attachment.   SURGEON:  Kerrin Champagne, MD   ASSISTANT:  Wende Neighbors, PA-C   ANESTHESIA:  General via orotracheal intubation, Dr. Noreene Larsson,  preoperatively a right anterior scalene block.  Infiltration locally  with Marcaine 0.5% with 1:200,000 epinephrine, 8 mL.   FINDINGS:  Recurrent tear, right shoulder rotator cuff complete through  the supraspinatus, biceps tendon had been used for the old repair.  There is a downsloping acromion process laterally with impingement  laterally.  Prominent stump of avulsed biceps tendon detachment.  Biceps  tendon had been avulsed.   ESTIMATED BLOOD LOSS:  50 mL.   COMPLICATIONS:  None.   BRIEF CLINICAL HISTORY:  The patient is a 77 year old female who has  undergone previous right shoulder rotator cuff repair in 2004 by a  Careers adviser in [place] or [place].  She has done well following this  surgery, has had problems with previous ankle injury and left total hip  replacement.  Has mild lateral knee osteoarthritis and more recently has  experienced worsening pain of the right shoulder following her hip  surgery.  She was treated conservatively with injection treatment  therapy, underwent MRI study because of persistent pain and discomfort  which demonstrated a recurrent right shoulder rotator cuff tear  involving supraspinatus tendon full thickness.  Only mild osteoarthritis  changes of the AC joint.  She was brought to the operating after having  failed conservative management of the right shoulder rotator cuff tear  with persistent pain and discomfort and requiring narcotics and  nocturnal pain.   INTRAOPERATIVE FINDINGS:  As above.   DESCRIPTION OF PROCEDURE:  After adequate general anesthesia, the  patient on Allen frame, all pressure points well padded.  Neck and head  well situated in neutral position, slightly turned to the left.  Standard prep with DuraPrep solution from the right wrist  circumferentially from the axilla over the anterior chest wall, superior  aspect of the shoulder, base of the neck laterally, and over the  shoulder blade and scapula posteriorly.  Draped in the usual manner.  Iodine Vi-drape was used.  Standard preoperative antibiotics of  vancomycin.  She had standard preoperative protocol with marking of the  shoulder  correct side in the preop holding area.  Standard time-out  procedure intraoperatively identifying the patient and procedure to be  performed, and participants and expected concerns.  Following draping,  then the patient's incision line was over the anterior one-third of the  right shoulder in line with the acromion anterolaterally defining all  landmarks.  The incision approximately was 4-5 cm in length beginning  just over the superior aspect of the lateral anterior margin of the  acromion and extended in line with the deltoid.  After infiltration with  Marcaine 0.5% with 1:200,000 epinephrine, 8 mL, incision was carried  through skin and subcu layers directly down to the  superficial fascial  layer.  This was incised in line with the skin with an old previous  fascial incision and carried over the superior aspect of the acromion  process, the deltoid attachments were then carefully undermined  subperiosteally, laterally, as well anteriorly defining an anterior  downslope of the acromion that was impinging on the rotator cuff.  The  incision was carried to the subdeltoid bursa region.  The subdeltoid  bursa was debrided lightly anteriorly.   Self-retaining retractors.  Then, a retractor placed beneath the  acromion process and a small sliding saw was then used to carefully cut  a beveled portion of the acromion process over the inferior aspect,  removing a wafer about 3-4 mm anteriorly but extending it laterally to  remove the osteophyte over the lateral inferior aspect of the acromion  that was still causing persistent impingement.  Osteotome was used to  remove this wafer bone as well as Frankey Poot rongeurs, smoothing this area,  and then bone wax was applied to bleeding bony surface.  This  decompressed the shoulder quite nicely and allowed for exposure of the  shoulder.  The torn supraspinatus tendon was easily grasped using 2  Kocher clamps placed under tension.  Previous repair of the  supraspinatus to the infraspinatus posteriorly and biceps tendon  entrapment, and a suture head was identified.   Frayed and degenerative portions of the tendon were carefully trimmed  and removed using 15-blade scalpel.  Inspection of the joint  demonstrated that the biceps tendon had avulsed from its superior  attachment to the labrum.  There was a thickened prominent stump  superiorly and medially.  This stump was then resected using a 15-blade  scalpel to smooth labral surface.  No further labral injuries were  noted.  Irrigation was carried out.  Biceps tendon apparently had been  ligated or tenodesed within the biceps groove anteriorly.  The leading  edge of the  supraspinatus was carefully trimmed 2-3 mm to bleeding  surface.  A #1 Ethibond suture was then placed, 2 Ethibond sutures using  a Tajima modified Kessler-like suture on the leading edge of the  supraspinatus tendon.  The supraspinatus attachment groove was then made  using a three-quarter inch curved osteotome performing a trough over the  medial aspect of the greater tuberosity about 3/4-1 inch in length and a  depth of about 3-4 mm.  High-speed bur was then used to bur and smooth  the entry point of the lateral aspect of the humeral head into this  opening of trough, removing cartilage laterally to allow for a base of  bleeding cancellous bone for the tendon to attach over broader surface.  Two Mitek anchors were then placed into the subchondral bone to be  passed through the leading edge of the tendon after its placement into  the  trough.  The patient has a single suture used to approximate a  portion of the supraspinatus proximally to that of the previous old  repair site with biceps tendon using a #1 Ethibond suture.  Next, awl  was then used to make 2 holes over the inferior aspect of the greater  tuberosity laterally and these were then connected to the trough using  the awl and the Lewin-type clamp.  A suture passer was then used to pass  the sutures through these openings and the anterior Tajima-like stitch  was passed through the 2 holes, one of the posterior Tajima sutures  anteriorly was passed through the posterior hole of the two made  laterally and the last was passed through attachment of the  infraspinatus tendon to the greater tuberosity.  The shoulder was then  abducted and noted that the Mitek anchor sutures were then passed  through the tendon of the supraspinatus proximal to its attachment to  allow for 2-layered closure.  With the shoulder then abducted, each of  the Tajima sutures were then tightened and sutured into place anchoring  the leading edge of the  supraspinatus into the trough provided.  The  posterior stitch included the infraspinatus attachment and biceps tendon  attaching this to the leading edge of the supraspinatus as well.  Then,  both of the Mitek anchor sutures were tightened down and sutured in  place performing 2-layered closure.  All remaining sutures were removed.  A single #1 Ethibond suture was used to smooth the area of previous  tenodesis of the biceps tendon.  This completed the repair.  The  shoulder could be brought down to the side but it felt to be quite tight  so an abduction pillow DonJoy type brace was required.  Irrigation was  then carried out.  Careful inspection demonstrated no active bleeding at  this point.  The tendinous portion of the deltoid then approximated to  the strong fascial layer over the top of the acromion process using 0  Ethibond sutures.  A 0 Vicryl suture was used to approximate the fascial  layer of the deltoid, then 2-0 Vicryl sutures, and then skin closed with  a running subcu stitch and 4-0 Vicryl.  Dermabond applied and Coverlet  dressing.  The arm was placed into an abduction type DonJoy sling and  shoulder immobilizer.  The patient was then reactivated, extubated, and  returned to recovery room in satisfactory condition.  All instrument and  sponge counts were correct.      Kerrin Champagne, M.D.  Electronically Signed     JEN/MEDQ  D:  09/29/2008  T:  09/30/2008  Job:  [id number]

## 2010-11-29 NOTE — Assessment & Plan Note (Signed)
[facility]                            CARDIOLOGY OFFICE NOTE   Brenda, Werner                     MRN:          [id number]  DATE:03/15/2007                            DOB:          1929-08-08    Brenda Werner is a pleasant patient of Dr. Audria Nine.   She is referred for pre-op clearance.   The patient's coronary risk factors include:  1. Hypertension.  2. Hypercholesterolemia.   She apparently had an abnormal EKG when being screened for her  orthopedic surgery.  The patient has had significant hip pain.  She was  scheduled for a hip replacement and apparently there was a concern on  her pre-op EKG.  It was read as supraventricular arrhythmia with a  prolonged QT.  She was asked to be evaluated for surgical clearance.   I reviewed the electrocardiogram from August 22.  It is a fairly benign  EKG.  The QT interval is only 460.  She has a low atrial focus with  inverted P waves in II, III, and F.   In talking to the patient she has never had palpitations.  There has  been no syncope.  There is no history of arrhythmia.  No history of  atrial fibrillation.   Her activity level over the last month has been curtailed due to her hip  pain.  She clearly needs hip surgery as this has interfered with her  lifestyle.  She does have a history of hypertension,  hypercholesterolemia.  She has not had a stress test or echocardiogram  in the recent past.   The patient's review of systems is otherwise negative.   PAST MEDICAL HISTORY:  1. Traumatic left ankle injury requiring surgery back in the 1980s.  2. She has had right shoulder surgery.  Other than this she has not      had any significant surgery.  There were no complications or      anesthetic difficulties with these surgeries.   She has no known allergies.   MEDICATIONS:  1. Potassium 20 a day.  2. Aspirin a day.  3. Cartia 240 a day.  4. Paroxetine 20 a day.  5. Lipitor 20 a day.  6.  Triamterene/hydrochlorothiazide.   FAMILY HISTORY:  Noncontributory.   The patient is separated.  She has 8 children.  She lives with one of  her daughters.  She is fairly sedentary and particularly lately due to  hip pain.  She does not drink or smoke.  She has few hobbies and  primarily talks to neighbors around the neighborhood and stays at home.   PHYSICAL EXAMINATION:  GENERAL:  Remarkable for an elderly black female  in no distress.  VITAL SIGNS:  Blood pressure is 134/70, pulse is 84 and regular,  respiratory rate is 14.  She is afebrile.  Weight is 148.  HEENT:  Normal.  NECK:  Carotids normal without bruit.  There is no JVP elevation.  No  thyromegaly.  No lymphadenopathy.  LUNGS:  Clear.  Good diaphragmatic motion.  No wheezing.  HEART:  There is an S1 S2 with  normal heart sounds.  PMI is normal.  ABDOMEN:  Benign.  There is no AAA.  No tenderness.  Bowel sounds are  positive.  No bruit.  No hepatosplenomegaly or hepatojugular reflux.  Femoral pulses are plus 3 bilaterally without bruit.  EXTREMITIES:  Distal pulses are intact.  No edema.  NEUROLOGIC:  Nonfocal.  There is no muscular weakness.  SKIN:  Warm and dry.  She has surgical scars on either side of the left  ankle as well as the right shoulder from her previous orthopedic  surgeries.   IMPRESSION:  1. The patient has a low atrial focus.  I do not think this is an      atrial problem.  At some point in the future it may be better to      treat her high blood pressure with an ACE inhibitor as Cartia could      theoretically cause sinal nodal dysfunction.  However, the patient      is asymptomatic and I suspect that the lower atrial focus is just      beating at a faster rate than her sinus node.  She would not be at      particularly high risk for atrial fibrillation.  She is otherwise      stable and clear for surgery without echocardiogram or Myoview      needed.  2. Hypertension, currently well treated.  As  indicated consider      changing Cartia to an ARB or ACE inhibitor.  This could be combined      with her diuretic.  3. Hyperlipidemia.  Continue Lipitor 20 mg a day.  Lipid and liver      profile.  Follow up with Dr. Audria Nine.  4. Mild lower extremity edema.  Per the patient this is the reason she      is on her low dose diuretic.  She currently has no edema in her      legs and this will be continued.  She appears euvolemic.   ADDENDUM:  The patient had an EKG done in our office.  The P waves are  now upright in II, III, and F.  She has nonspecific ST-T wave changes  with a QT interval of 402, corrected to 472.  Again, these would appear to be benign and in the setting of no prior  cardiac history and a fairly normal exam.  She is cleared for orthopedic  surgery.     Noralyn Pick. Eden Emms, MD, [facility]  Electronically Signed    PCN/MedQ  DD: 03/15/2007  DT: 03/15/2007  Job #: [id number]   cc:   Kerrin Champagne, M.D.  Maurice March, M.D.

## 2010-11-29 NOTE — Op Note (Signed)
NAMENASTASSIA, BAZALDUA NO.:  [id number]   MEDICAL RECORD NO.:  [id number]          PATIENT TYPE:  INP   LOCATION:  5024                         FACILITY:  [facility]   PHYSICIAN:  Kerrin Champagne, M.D.   DATE OF BIRTH:  January 04, 1930   DATE OF PROCEDURE:  03/19/2007  DATE OF DISCHARGE:                               OPERATIVE REPORT   PREOPERATIVE DIAGNOSIS:  Severe osteoarthritis, left hip.   POSTOPERATIVE DIAGNOSIS:  Severe osteoarthritis, left hip.   PROCEDURE:  Left total hip arthroplasty using DePuy porous coated 52 mm  ASR acetabular cup with a #4 cement Summit femoral stem with +2 neck  sleeve and 46 mm femoral implant, metal-on-metal, number 3 cement  extractor with 12 mm stem centralizer.   SURGEON:  Kerrin Champagne, M.D.   ASSISTANT:  Wende Neighbors, P.A.-C.   ANESTHESIA:  General via orotracheal intubation, Dr. Ivin Booty.   FINDINGS:  Severe osteoarthritis of the left hip, both acetabular and  femoral head changes, denuded bone with areas of severe hypertrophic  bone formation.   ESTIMATED BLOOD LOSS:  300 mL.   COMPLICATIONS:  None.   DRAINS:  Hemovac drain left hip, Foley straight drain.   DISPOSITION:  The patient returned to the PACU in good condition.   HISTORY OF PRESENT ILLNESS:  The patient is a 77 year old female who has  been experiencing severe left hip pain, discomfort with ambulation, for  several years to the point now where she is restricted with her ability  to stand or ambulate any distance, difficulty walking stairs, reaching  her shoes and socks.  She has had a history of previous left ankle  injury, previous right knee osteoarthritis changes.  Medical history  significant for hypertension and hypercholesterolemia.  The patient is  felt to have a small atrial focus at high risk for atrial fibrillation.  Consent was obtained for surgery with [place], Dr. Eden Emms one week ago.  She is brought to the operating room to undergo left total  hip  arthroplasty at age 54 using a cemented femoral stem to prevent thigh  pain and porous coated acetabular implant to allow for bone to  components healing here.  Intraoperative findings as above.   DESCRIPTION OF PROCEDURE:  After adequate general anesthesia, the  patient had a Foley catheter placed.  Standard preoperative antibiotics  of vancomycin for penicillin allergy.  Standard prep with DuraPrep  solution over the left lower rib margin following placement in the right  lateral decubitus position, left side up, with a Montreal lateral frame.  All pressure points were well padded.  Following prep with DuraPrep  solution from the left lower rib margin, left buttock, and hip area  circumferentially about the left thigh and calf down to the ankle, the  patient was draped in the usual manner, iodine Vidrape was used.  Incision standard Southern approach in the left hip, approximately 12-14  cm length, through skin and subcutaneous layers down to the tensor  fascia distally and tensor muscle proximally.  These incised in line  with their fibers distally over the greater trochanter, extended  distally with curved Mayo and proximally into the tensor muscle using  electrocautery.  Charnley retractor was inserted.  The bursa overlying  the greater trochanter was then debrided and short external rotators  identified. The gluteus medius retracted anteriorly and the piriformis  tendon then tagged, divided off the greater trochanter.  This revealed a  posterior hip capsule. The interval between the hip capsule and short  external rotators then developed using a small Cobb elevator. The short  external rotators were then tagged and divided off the proximal  posterior aspect of the femur.  Two tags were used for this.  A T-  capsular incision was then performed on the inferior border of the  gluteus minimus and continued up to the neck.  The lesser trochanter was  identified and exposed.  The  hip internally rotated and flexed and  abducted, dislocating left hip.  A Cobra retractor was placed about the  femoral neck and the template for cutting the femoral neck was then used  scoring the neck in the correct location.  This was then cut using  oscillating saw.  Femoral head demonstrated severe osteoarthritis  changes, pitting, and osteophytes about the head and neck interval.  Following cutting of this, then the proximal femur was retracted  anterior to the acetabulum using a single small Holman.  The posterior  acetabulum was exposed using a Cobra retractor through the area of the  transverse acetabular ligament and a cerebellar retractor over the short  external rotators posteriorly.  The knife and electrocautery were then  used to debride the labrum and acetabulum circumferentially.  This  completed, the pulvinar was excised from the fovea using a large curet  down to the base of the fovea being exposed.  Bleeders controlled using  electrocautery.  Reaming was then carried out using a 43 mm reamer up to  a size 52 reamer.  A trial was then performed using a 52 mm acetabular  trial shell.  This provided excellent fit.  The 52 mm ASR cup was then  placed using the permanent implant.  This was then impacted into place  into the reamed acetabulum in the correct degree of anteversion,  approximately 30 degrees of anteversion and flexion and abduction.  This  seated the prosthesis well in the acetabulum.  Attention was then turned  to the proximal femur.  A sponge was placed within the acetabulum, the  hip again internally rotated, flexed, and abducted.  Retractors were  used to expose the proximal portion of the femur.  A step-cut entry  reamer was then used to perform entry point into the piriformis fossa.  The canal finder was then used to probe the canal.  Reaming then carried  out to a #4 cement stem.  Broaching then performed up to a #4 cement  broach provided excellent fit.   Trial reduction was then performed using  a +2 neck with a 47 mm outside shell.  This was not easily reduced but  provided excellent fit, the leg able to be brought into full extension,  the knee flexed to 90 degrees indicating some slight tightness.  The hip  could not be shucked with the +2 mm implant.  Flexion over 90 degrees  and internal rotation to almost 85-90 degrees without any evidence of  subluxation and a quite stable metal implant.  The hip was then  dislocated, the permanent prosthesis brought onto the field including a  #4 cement stem, a +2 neck sleeve with  a 47 mm outside amateur shell.  Again, a sponge was placed within the patient's acetabular component  protecting this.  Using a Mueller and Cobra retractor, then the proximal  femur was nicely exposed.  The femoral trial broach was then removed and  a trial was performed with the cement plug. A #3 cement plug provided  excellent fit just at the tip of the broach and was carried out to 10 mm  below the expected tip of the stem.  A #3 cement restricter was then  placed at 14 cm distal to the medial calcar at the expected tip of the  broach.  This completed, then, irrigation was carried out in the  proximal femoral channel using pulsatile irrigation system with a bottle  brush removing any loose debris within the proximal femoral channel.  The cement was then mixed as the permanent prosthesis was on the field.  Following mixing of the cement to the correct consistency, a small  amount was placed on the #4 cement stem and the 12 mm centralizer was  placed.  This was then left to the side and cement was then placed  within the proximal intermedullary channel after first packing the canal  with an epinephrine solution sponge.  This provided adequate hemostasis  as well as the clear the canal of blood and debris.  The leg was lifted  to further clear any debris and suction applied.  Cement was then placed  within the proximal  femoral channel carefully filling the channel while  removing the nozzle.  The nozzle was removed further and also was then  divided at the appropriate scored margin and the trocar used to remove  any further cement from within the nozzle length, removing the nozzle in  its entirety.  Finger packing then performed.  Gray nozzle grommet then  placed over the end of the cement gun nozzle.  This was then placed over  the proximal femur and pressurization of cement obtained.  Excellent  pressurization was obtained.  Stem was then placed in the proper degree  of anteversion and placed within the proximal femoral channel down to  the calcar.  Note, calcar planing had been carried out with previous  broaching using the #3 broach before placing the #4 cement stem broach.  This was done earlier in the case.  With the stem held in place, then  cement was debrided circumferentially about the calcar.  All excess  cement was excised from the wound, as well.  Sponges were used to resect  soft tissue with insertion of the prosthesis.  The prosthesis was held  in about 10 degrees of anteversion and the cement went on to fully heal  after further cement had been removed from the trochanteric area, as  well.  When cement was completely hardened careful inspection of the  incision demonstrated no further bone debris or cement debris within the  incision.  The sponge was removed from the acetabulum.  Complete drying,  cleansing, and Morse taper of the femoral stem was carried out and the  +2 sleeve was then placed within the femoral head shell and then this  was impacted into place with the [facility] taper of the femoral stem.  Once  this was completed, the hip was then reduced and placed through a range  of motion over 90 degrees internal rotation over 85 degrees without  evidence of subluxation abduction was also performed demonstrating  excellent stability.  The hip was able to be brought  to full extension   with external rotation, no sign of anterior instability.  Irrigation was  then carried out. Small bone or cement debris further debrided using  small hemostats.  Bleeding controlled using electrocautery.  Two drill  holes placed through the proximal femur posteriorly and suture passer  was used to pass sutures for the short external rotator using #1.  The  hip capsule was then approximated with interrupted #1 Ethilon sutures.  The short external rotators were reattached to the proximal posterior  femur with #1 Ethibond sutures.  The piriformis tendon then approximated  to the greater trochanter with #1 Ethibond sutures.  Following further  irrigation, soft tissues were allowed to fall back into place and  abduction of the leg held in place at the base between the legs.  The  medium Hemovac drain was placed in the depth of the incision exiting  over the anterolateral aspect of the hip.  The tensor fascia was  approximated with interrupted #1 Vicryl sutures.  The superficial  fascial layer tensor muscle approximated with interrupted #1 Vicryl  sutures.  The deep subcu layers approximated with interrupted #1 and 0  Vicryl suture, more superficial layers with interrupted 2-0 Vicryl  sutures, and the skin closed with a running subcuticular stitch of 4-0  Vicryl.  Tincture of Benzoin and Steri-Strips applied, 4x4s, ABD pad  affixed to the skin with Hypafix.  The patient then had a knee  immobilizer placed, was returned to a supine position, reactivated,  extubated, and returned to the recovery room in satisfactory condition.  All instrument and sponge counts were correct.      Kerrin Champagne, M.D.  Electronically Signed     JEN/MEDQ  D:  03/19/2007  T:  03/19/2007  Job:  [id number]

## 2010-12-02 NOTE — Discharge Summary (Signed)
NAMELAKETRA, BOWDISH NO.:  [id number]   MEDICAL RECORD NO.:  [id number]          PATIENT TYPE:  INP   LOCATION:  5024                         FACILITY:  [facility]   PHYSICIAN:  Kerrin Champagne, M.D.   DATE OF BIRTH:  Jan 31, 1930   DATE OF ADMISSION:  03/19/2007  DATE OF DISCHARGE:  03/23/2007                               DISCHARGE SUMMARY   ADMISSION DIAGNOSES:  1. Severe osteoarthritis, left hip.  2. Gastroesophageal reflux disease.  3. Depression.  4. Irregular heart rate.  5. Hypertension.  6. Depression.   DISCHARGE DIAGNOSES:  1. Severe osteoarthritis, left hip.  2. Gastroesophageal reflux disease.  3. Depression.  4. Irregular heart rate.  5. Hypertension.  6. Depression.  7. Posthemorrhagic anemia requiring blood transfusion.  8. Hyponatremia and hypokalemia, resolved at discharge.  9. Postoperative ileus, cleared at discharge.   PROCEDURE:  On March 19, 2007, the patient underwent left total hip  arthroplasty performed by Dr. Otelia Sergeant, assisted by Maud Deed, PA-C,  under general anesthesia.   CONSULTATIONS:  None.   BRIEF HISTORY:  The patient is a 77 year old female with severe left hip  pain for several years.  Now restricted in ambulatory status due to her  pain.  Radiographs have shown severe osteoarthritis of the left hip.  It  was felt that she would benefit from surgical intervention.  She did  receive preoperative surgical clearance through the [facility]  cardiologists as she was felt to be high risk for atrial fib.  The  patient tolerated the procedure without difficulties.  Postoperatively  neurovascular motor function was intact.  She was placed on Coumadin for  DVT and PE prophylaxis.  Adjustments made according to daily protimes by  the pharmacist.  The patient had postop anemia, treated with 2 units of  autologous blood.  At discharge, hemoglobin and hematocrit 10.3 and  30.0.  The patient was treated with incentive spirometry and  coughing  and deep breathing as chest x-ray showed decreased volume and she did  have some episodes of hypoxia.  Handheld nebulizers with albuterol  utilized.  The patient was started on physical therapy for ambulation  and gait training.  She was allowed weightbearing as tolerated on the  operative extremity.  She was able to advance to ambulate with a rolling  walker as much as 80 feet.  Strengthening and range of motion exercises  as well as total hip replacement precautions were taught to the patient.  The patient developed some diarrhea and had an upset stomach on the  third postoperative day.  It was felt that she possibly did have a mild  ileus.  Eventually with the help of enema and laxatives she was able to  have a bowel movement and her stomach upset was resolved.  She was  placed on potassium supplementation for her hypokalemia.  The patient  was weaned from IV analgesics to p.o. analgesics.  At discharge INR was  1.6.  Dressing changes were done daily and her wound was healing well  during the hospital stay.   PLAN:  The patient was discharged to her  home with arrangements for home  health physical therapy and occupational therapy.  She was given Vicodin  1 to 2 every 4 to 6 hours as needed for pain, Coumadin as instructed by  the pharmacist, over-the-counter stool softeners daily.  She will have  home health management for Coumadin.  Follow up with Dr. Otelia Sergeant 2 weeks  from the date of surgery.  She is encouraged in  ambulation with her  walker.  Weightbearing as tolerated on the operative extremity.  All  questions encouraged and answered.   CONDITION ON DISCHARGE:  Stable.      Wende Neighbors, P.A.      Kerrin Champagne, M.D.  Electronically Signed    SMV/MEDQ  D:  07/24/2007  T:  07/24/2007  Job:  [id number]

## 2011-04-10 LAB — CBC
Hemoglobin: 12.4
RBC: 3.84 — ABNORMAL LOW
RDW: 13.8
WBC: 9.1

## 2011-04-10 LAB — I-STAT 8, (EC8 V) (CONVERTED LAB)
Acid-Base Excess: 4 — ABNORMAL HIGH
Bicarbonate: 26 — ABNORMAL HIGH
Glucose, Bld: 109 — ABNORMAL HIGH
Hemoglobin: 12.9
Potassium: 2.9 — ABNORMAL LOW
Sodium: 136
TCO2: 27

## 2011-04-10 LAB — DIFFERENTIAL
Basophils Relative: 0
Lymphocytes Relative: 19
Lymphs Abs: 1.7
Monocytes Absolute: 0.6
Monocytes Relative: 6
Neutro Abs: 6.5
Neutrophils Relative %: 72

## 2011-04-10 LAB — POCT CARDIAC MARKERS
CKMB, poc: <1 — ABNORMAL LOW
Troponin i, poc: <0.05

## 2011-04-10 LAB — PROTIME-INR: INR: 0.9

## 2011-04-10 LAB — POCT I-STAT CREATININE
Creatinine, Ser: 2.5 — ABNORMAL HIGH
Operator id: 161631

## 2011-04-28 LAB — BASIC METABOLIC PANEL
BUN: 13
BUN: 15
CO2: 26
Calcium: 9.3
Chloride: 100
Chloride: 101
Chloride: 102
Chloride: 104
GFR calc Af Amer: >60
GFR calc non Af Amer: 52 — ABNORMAL LOW
Glucose, Bld: 119 — ABNORMAL HIGH
Potassium: 2.8 — ABNORMAL LOW
Potassium: 3.3 — ABNORMAL LOW
Potassium: 3.3 — ABNORMAL LOW
Potassium: 3.4 — ABNORMAL LOW
Sodium: 135
Sodium: 136
Sodium: 138

## 2011-04-28 LAB — CBC
HCT: 30.8 — ABNORMAL LOW
HCT: 31.1 — ABNORMAL LOW
Hemoglobin: 10.3 — ABNORMAL LOW
Hemoglobin: 10.4 — ABNORMAL LOW
Hemoglobin: 10.9 — ABNORMAL LOW
MCHC: 34.2
MCHC: 34.4
MCV: 93.6
MCV: 94.2
MCV: 94.4
Platelets: 147 — ABNORMAL LOW
RBC: 3.19 — ABNORMAL LOW
RBC: 3.29 — ABNORMAL LOW
RBC: 3.35 — ABNORMAL LOW
WBC: 13.5 — ABNORMAL HIGH
WBC: 8.6

## 2011-04-28 LAB — HEPATIC FUNCTION PANEL
ALT: 11
AST: 16
Alkaline Phosphatase: 61
Bilirubin, Direct: <0.1
Total Bilirubin: 0.5

## 2011-04-28 LAB — URINALYSIS, ROUTINE W REFLEX MICROSCOPIC
Glucose, UA: NEGATIVE
Specific Gravity, Urine: 1.021
pH: 6.5

## 2011-04-28 LAB — TYPE AND SCREEN

## 2011-04-28 LAB — PROTIME-INR
INR: 1.1
INR: 1.2
Prothrombin Time: 19.1 — ABNORMAL HIGH

## 2011-04-28 LAB — URINE MICROSCOPIC-ADD ON

## 2011-04-28 LAB — ABO/RH: ABO/RH(D): B POS

## 2011-07-18 HISTORY — PX: CATARACT EXTRACTION W/ INTRAOCULAR LENS  IMPLANT, BILATERAL: SHX1307

## 2011-09-11 ENCOUNTER — Other Ambulatory Visit (HOSPITAL_COMMUNITY): Payer: Self-pay | Admitting: Specialist

## 2011-09-11 DIAGNOSIS — T84038A Mechanical loosening of other internal prosthetic joint, initial encounter: Secondary | ICD-10-CM

## 2011-09-14 ENCOUNTER — Encounter (HOSPITAL_COMMUNITY)
Admission: RE | Admit: 2011-09-14 | Discharge: 2011-09-14 | Disposition: A | Discharge status: Home / Self Care | Payer: Medicare Other | Source: Ambulatory Visit | Attending: Specialist | Admitting: Specialist

## 2011-09-14 DIAGNOSIS — M25559 Pain in unspecified hip: Secondary | ICD-10-CM | POA: Insufficient documentation

## 2011-09-14 DIAGNOSIS — R109 Unspecified abdominal pain: Secondary | ICD-10-CM | POA: Insufficient documentation

## 2011-09-14 DIAGNOSIS — T84038A Mechanical loosening of other internal prosthetic joint, initial encounter: Secondary | ICD-10-CM

## 2011-09-14 MED ORDER — TECHNETIUM TC 99M MEDRONATE IV KIT
25.0000 | PACK | Freq: Once | INTRAVENOUS | Status: AC | PRN
  Administered 2011-09-14: 25 via INTRAVENOUS

## 2011-10-06 ENCOUNTER — Encounter: Payer: Self-pay | Admitting: Internal Medicine

## 2011-10-26 ENCOUNTER — Encounter: Payer: Self-pay | Admitting: Obstetrics and Gynecology

## 2011-11-01 ENCOUNTER — Ambulatory Visit (INDEPENDENT_AMBULATORY_CARE_PROVIDER_SITE_OTHER): Payer: Medicare Other | Admitting: Gastroenterology

## 2011-11-01 ENCOUNTER — Encounter: Payer: Self-pay | Admitting: Gastroenterology

## 2011-11-01 ENCOUNTER — Other Ambulatory Visit (INDEPENDENT_AMBULATORY_CARE_PROVIDER_SITE_OTHER): Payer: Medicare Other

## 2011-11-01 VITALS — BP 130/64 | HR 88 | Ht 63.25 in | Wt 156.4 lb

## 2011-11-01 DIAGNOSIS — R1011 Right upper quadrant pain: Secondary | ICD-10-CM

## 2011-11-01 LAB — CBC WITH DIFFERENTIAL/PLATELET
Basophils Absolute: 0 10*3/uL (ref 0.0–0.1)
Eosinophils Relative: 4 % (ref 0.0–5.0)
HCT: 40.2 % (ref 36.0–46.0)
Hemoglobin: 13.2 g/dL (ref 12.0–15.0)
Lymphocytes Relative: 17.7 % (ref 12.0–46.0)
Lymphs Abs: 1.4 10*3/uL (ref 0.7–4.0)
Monocytes Relative: 9.4 % (ref 3.0–12.0)
Platelets: 127 10*3/uL — ABNORMAL LOW (ref 150.0–400.0)
RDW: 13.4 % (ref 11.5–14.6)
WBC: 8.1 10*3/uL (ref 4.5–10.5)

## 2011-11-01 LAB — COMPREHENSIVE METABOLIC PANEL
CO2: 30 mEq/L (ref 19–32)
Calcium: 10.3 mg/dL (ref 8.4–10.5)
Chloride: 102 mEq/L (ref 96–112)
Creatinine, Ser: 1.7 mg/dL — ABNORMAL HIGH (ref 0.4–1.2)
GFR: 36.73 mL/min — ABNORMAL LOW (ref >60.00)
Glucose, Bld: 92 mg/dL (ref 70–99)
Total Bilirubin: 0.4 mg/dL (ref 0.3–1.2)

## 2011-11-01 NOTE — Patient Instructions (Signed)
You will have labs checked today in the basement lab.  Please head down after you check out with the front desk  (cbc, cmet) You will be set up for an upper endoscopy. A copy of this information will be made available to Dr. Venetia Night.

## 2011-11-01 NOTE — Progress Notes (Signed)
HPI: This is a   very pleasant 78 year old woman who is a somewhat poor historian.  She has nausea (blames acid).  Has feeling of food not moving out of stomach.   This has been a problem for about a year. She takes omeprazole twice daily before she eats. She thinks this has helped (has been taking it for over 2 months).  No dysphagia.  But a choking sensation.  She has "little sharp pain in her stomach bilaterally, lower."  Also has intermittent bilateral minor upper abd pains.  5/12 colonoscopy mild tics, hemorhoids, small polyp (was TA)  She is a very poor historian, not really sure why she was sent here.  Says she was told to come here for an xray of her stomach.  She has lot people in her family who are dying, unclear of why.  Pretty rare NSAIDs but    Review of systems: Pertinent positive and negative review of systems were noted in the above HPI section. Complete review of systems was performed and was otherwise normal.    Past Medical History  Diagnosis Date  . Anxiety   . Arthritis   . Cataract     had surgery  . Depression   . GERD (gastroesophageal reflux disease)   . Heart murmur   . Irregular heart beat     doesn't know type  . Hyperlipidemia   . Hypertension     Past Surgical History  Procedure Date  . Shoulder arthroscopy     twice on right  . Hip surgery     replacement, left  . Replacement total knee     right  . Foot fracture surgery     at ankle, left    Current Outpatient Prescriptions  Medication Sig Dispense Refill  . aspirin 81 MG tablet Take 81 mg by mouth daily.        Marland Kitchen atorvastatin (LIPITOR) 20 MG tablet Take 20 mg by mouth daily.        Marland Kitchen diltiazem (CARDIZEM CD) 300 MG 24 hr capsule Take 300 mg by mouth daily.      Marland Kitchen etodolac (LODINE) 400 MG tablet Take 400 mg by mouth 2 (two) times daily.        Marland Kitchen gabapentin (NEURONTIN) 100 MG capsule Take 100 mg by mouth 3 (three) times daily.        . Glucosamine-Chondroitin (GLUCOSAMINE CHONDR  COMPLEX PO) Take 1 tablet by mouth 2 (two) times daily.      Marland Kitchen ibuprofen (ADVIL,MOTRIN) 200 MG tablet Take 200 mg by mouth as needed.      Marland Kitchen omeprazole (PRILOSEC) 20 MG capsule Take 20 mg by mouth daily.      . potassium chloride (KLOR-CON) 10 MEQ CR tablet Take 20 mEq by mouth daily.        Marland Kitchen triamterene-hydrochlorothiazide (MAXZIDE) 75-50 MG per tablet Take 1 tablet by mouth daily.         Current Facility-Administered Medications  Medication Dose Route Frequency Provider Last Rate Last Dose  . 0.9 %  sodium chloride infusion  500 mL Intravenous Continuous Rachael Fee, MD        Allergies as of 11/01/2011 - Review Complete 11/01/2011  Allergen Reaction Noted  . Penicillins Other (See Comments) 11/07/2010  . Latex Rash 11/07/2010    Family History  Problem Relation Age of Onset  . Heart disease Father   . Esophageal cancer Sister     History   Social History  . Marital  Status: Legally Separated    Spouse Name: N/A    Number of Children: 8  . Years of Education: N/A   Occupational History  . retired    Social History Main Topics  . Smoking status: Never Smoker   . Smokeless tobacco: Never Used  . Alcohol Use: No  . Drug Use: No  . Sexually Active: Not on file   Other Topics Concern  . Not on file   Social History Narrative  . No narrative on file       Physical Exam: BP 130/64  Pulse 88  Ht 5' 3.25" (1.607 m)  Wt 156 lb 6 oz (70.931 kg)  BMI 27.48 kg/m2 Constitutional: generally well-appearing Psychiatric: alert and oriented x3 Eyes: extraocular movements intact Mouth: oral pharynx moist, no lesions Neck: supple no lymphadenopathy Cardiovascular: heart regular rate and rhythm Lungs: clear to auscultation bilaterally Abdomen: soft, nontender, nondistended, no obvious ascites, no peritoneal signs, normal bowel sounds Extremities: no lower extremity edema bilaterally Skin: no lesions on visible extremities    Assessment and plan: 78 y.o. female  with  GERD, intermittent abdominal pains.  She does take Lodine daily until perhaps she has peptic ulcer disease related to that. We will proceed with EGD at her soonest convenience. She is also going to get a set of labs today including a CBC and a complete metabolic profile.

## 2011-11-02 ENCOUNTER — Ambulatory Visit (INDEPENDENT_AMBULATORY_CARE_PROVIDER_SITE_OTHER): Payer: Medicare Other | Admitting: Obstetrics and Gynecology

## 2011-11-02 ENCOUNTER — Encounter: Payer: Self-pay | Admitting: Obstetrics and Gynecology

## 2011-11-02 VITALS — BP 140/76 | Resp 14 | Ht 63.0 in | Wt 157.0 lb

## 2011-11-02 DIAGNOSIS — Z30432 Encounter for removal of intrauterine contraceptive device: Secondary | ICD-10-CM | POA: Insufficient documentation

## 2011-11-02 NOTE — Progress Notes (Signed)
Chief Complaint  Patient presents with  . New Evaluation    58 y/0 pt desiring removal of IUD that has been in place for 45+ yrs. JO  Mammo 1-2 yrs ago Colonoscopy 2012 Dexa. Never Pap ? Never abnl PCP/ Cannot recall name. JO  No complaints Wants IUD removed - told she still had it 80yrs ago when she had xray for hip surgery.  It's been in place 71yrs. Pelvic Exam Strings not visible Otherwise nl exam A/P schedule removal of IUD under u/s guidance

## 2011-11-07 ENCOUNTER — Ambulatory Visit (AMBULATORY_SURGERY_CENTER): Payer: Medicare Other | Admitting: Gastroenterology

## 2011-11-07 ENCOUNTER — Encounter: Payer: Self-pay | Admitting: Gastroenterology

## 2011-11-07 VITALS — BP 150/72 | HR 64 | Temp 97.8°F | Resp 18 | Ht 63.0 in | Wt 156.0 lb

## 2011-11-07 DIAGNOSIS — K319 Disease of stomach and duodenum, unspecified: Secondary | ICD-10-CM

## 2011-11-07 DIAGNOSIS — R1011 Right upper quadrant pain: Secondary | ICD-10-CM

## 2011-11-07 DIAGNOSIS — K2990 Gastroduodenitis, unspecified, without bleeding: Secondary | ICD-10-CM

## 2011-11-07 DIAGNOSIS — K2970 Gastritis, unspecified, without bleeding: Secondary | ICD-10-CM

## 2011-11-07 MED ORDER — SODIUM CHLORIDE 0.9 % IV SOLN: 500.0000 mL | INTRAVENOUS | Status: DC

## 2011-11-07 NOTE — Op Note (Signed)
[facility] [address] [place], [place]  [address]  ENDOSCOPY PROCEDURE REPORT  PATIENT:  Brenda, Werner  MR#:  [id number] BIRTHDATE:  March 25, 1930, 81 yrs. old  GENDER:  female ENDOSCOPIST:  Rachael Fee, MD REFERRING:   Jaclyn Shaggy, MD PROCEDURE DATE:  11/07/2011 PROCEDURE:  EGD with biopsy, 401[address] ASA CLASS:  Class II INDICATIONS:  dyspepsia, RUQ discomfort MEDICATIONS:  Fentanyl 25 mcg IV, These medications were titrated to patient response per physician's verbal order, Versed 3 mg IV TOPICAL ANESTHETIC:  Cetacaine Spray  DESCRIPTION OF PROCEDURE:   After the risks benefits and alternatives of the procedure were thoroughly explained, informed consent was obtained.  The LB GIF-H180 [id number] endoscope was introduced through the mouth and advanced to the second portion of the duodenum, without limitations.  The instrument was slowly withdrawn as the mucosa was fully examined. <<PROCEDUREIMAGES>> There was mild, non-specific gastritis. Biopsies taken and sent to pathology (jar 1) (see image5).  Otherwise the examination was normal (see image7, image6, image4, image3, and image1). Retroflexed views revealed no abnormalities.    The scope was then withdrawn from the patient and the procedure completed. COMPLICATIONS:  None  ENDOSCOPIC IMPRESSION: 1) Mild gastritis, biopsied to check for H. pylori 2) Otherwise normal examination  RECOMMENDATIONS: 1) Await pathology results  ______________________________ Rachael Fee, MD  n. eSIGNED:   Rachael Fee at 11/07/2011 08:54 AM  Desma Maxim, [phone]

## 2011-11-07 NOTE — Progress Notes (Signed)
Patient did not have preoperative order for IV antibiotic SSI prophylaxis. (G8918) Patient did not experience any of the following events: a burn prior to discharge; a fall within the facility; wrong site/side/patient/procedure/implant event; or a hospital transfer or hospital admission upon discharge from the facility. (G8907) Patient did not have preoperative order for IV antibiotic SSI prophylaxis. (G8918)  

## 2011-11-07 NOTE — Patient Instructions (Addendum)
YOU HAD AN ENDOSCOPIC PROCEDURE TODAY AT THE [facility]: Refer to the procedure report that was given to you for any specific questions about what was found during the examination.  If the procedure report does not answer your questions, please call your gastroenterologist to clarify.  If you requested that your care partner not be given the details of your procedure findings, then the procedure report has been included in a sealed envelope for you to review at your convenience later.  YOU SHOULD EXPECT: Some feelings of bloating in the abdomen. Passage of more gas than usual.  Walking can help get rid of the air that was put into your GI tract during the procedure and reduce the bloating. If you had a lower endoscopy (such as a colonoscopy or flexible sigmoidoscopy) you may notice spotting of blood in your stool or on the toilet paper. If you underwent a bowel prep for your procedure, then you may not have a normal bowel movement for a few days.  DIET: Your first meal following the procedure should be a light meal and then it is ok to progress to your normal diet.  A half-sandwich or bowl of soup is an example of a good first meal.  Heavy or fried foods are harder to digest and may make you feel nauseous or bloated.  Likewise meals heavy in dairy and vegetables can cause extra gas to form and this can also increase the bloating.  Drink plenty of fluids but you should avoid alcoholic beverages for 24 hours.  ACTIVITY: Your care partner should take you home directly after the procedure.  You should plan to take it easy, moving slowly for the rest of the day.  You can resume normal activity the day after the procedure however you should NOT DRIVE or use heavy machinery for 24 hours (because of the sedation medicines used during the test).    SYMPTOMS TO REPORT IMMEDIATELY: A gastroenterologist can be reached at any hour.  During normal business hours, 8:30 AM to 5:00 PM Monday through Friday,  call [phone].  After hours and on weekends, please call the GI answering service at [phone] who will take a message and have the physician on call contact you.   Following lower endoscopy (colonoscopy or flexible sigmoidoscopy):  Excessive amounts of blood in the stool  Significant tenderness or worsening of abdominal pains  Swelling of the abdomen that is new, acute  Fever of 100F or higher  Following upper endoscopy (EGD)  Vomiting of blood or coffee ground material  New chest pain or pain under the shoulder blades  Painful or persistently difficult swallowing  New shortness of breath  Fever of 100F or higher  Black, tarry-looking stools  FOLLOW UP: If any biopsies were taken you will be contacted by phone or by letter within the next 1-3 weeks.  Call your gastroenterologist if you have not heard about the biopsies in 3 weeks.  Our staff will call the home number listed on your records the next business day following your procedure to check on you and address any questions or concerns that you may have at that time regarding the information given to you following your procedure. This is a courtesy call and so if there is no answer at the home number and we have not heard from you through the emergency physician on call, we will assume that you have returned to your regular daily activities without incident.  SIGNATURES/CONFIDENTIALITY: You and/or your care   partner have signed paperwork which will be entered into your electronic medical record.  These signatures attest to the fact that that the information above on your After Visit Summary has been reviewed and is understood.  Full responsibility of the confidentiality of this discharge information lies with you and/or your care-partner.  

## 2011-11-08 ENCOUNTER — Telehealth: Payer: Self-pay

## 2011-11-08 NOTE — Telephone Encounter (Signed)
  Follow up Call-  Call back number 11/07/2011 11/21/2010  Post procedure Call Back phone  # [phone] cell,     Permission to leave phone message Yes -     Patient questions:  Do you have a fever, pain , or abdominal swelling? no Pain Score  0 *  Have you tolerated food without any problems? yes  Have you been able to return to your normal activities? yes  Do you have any questions about your discharge instructions: Diet   no Medications  no Follow up visit  no  Do you have questions or concerns about your Care? no  Actions: * If pain score is 4 or above: No action needed, pain <4.

## 2011-11-15 ENCOUNTER — Other Ambulatory Visit: Payer: Self-pay

## 2011-11-15 MED ORDER — OMEPRAZOLE 20 MG PO CPDR: 20.0000 mg | DELAYED_RELEASE_CAPSULE | Freq: Two times a day (BID) | ORAL | Status: DC

## 2011-11-15 MED ORDER — CLARITHROMYCIN 500 MG PO TABS: 500.0000 mg | ORAL_TABLET | Freq: Two times a day (BID) | ORAL | Status: AC

## 2011-11-15 MED ORDER — METRONIDAZOLE 250 MG PO TABS: 250.0000 mg | ORAL_TABLET | Freq: Four times a day (QID) | ORAL | Status: AC

## 2011-11-15 NOTE — Telephone Encounter (Signed)
Pt aware and medication was sent separately for insurance purposes and the pt's allergy to Penicillins

## 2011-11-16 ENCOUNTER — Other Ambulatory Visit: Payer: Self-pay | Admitting: Obstetrics and Gynecology

## 2011-11-16 ENCOUNTER — Encounter: Payer: Self-pay | Admitting: Obstetrics and Gynecology

## 2011-11-16 ENCOUNTER — Ambulatory Visit (INDEPENDENT_AMBULATORY_CARE_PROVIDER_SITE_OTHER): Payer: Medicare Other | Admitting: Obstetrics and Gynecology

## 2011-11-16 ENCOUNTER — Ambulatory Visit (INDEPENDENT_AMBULATORY_CARE_PROVIDER_SITE_OTHER): Payer: Medicare Other

## 2011-11-16 VITALS — BP 110/62 | HR 70 | Resp 16 | Ht 63.0 in | Wt 158.0 lb

## 2011-11-16 DIAGNOSIS — Z30431 Encounter for routine checking of intrauterine contraceptive device: Secondary | ICD-10-CM

## 2011-11-16 DIAGNOSIS — Z30432 Encounter for removal of intrauterine contraceptive device: Secondary | ICD-10-CM

## 2011-11-16 NOTE — Progress Notes (Signed)
No complaints U/S reviewed - ut wnl 4.7cm with nl bil ovaries Looks like Lippes Loop IUD and is at the fundus  Filed Vitals:   11/16/11 1150  BP: 110/62  Pulse: 70  Resp: 16   A/P I discussed rec to remove in the hosp with hysteroscopy/D&C. Pt is agreeable Will schedule

## 2011-11-27 ENCOUNTER — Encounter (HOSPITAL_COMMUNITY): Payer: Self-pay | Admitting: Emergency Medicine

## 2011-11-27 ENCOUNTER — Observation Stay (HOSPITAL_COMMUNITY)
Admission: EM | Admit: 2011-11-27 | Discharge: 2011-11-28 | Disposition: A | Discharge status: Home / Self Care | Payer: Medicare Other | Source: Ambulatory Visit | Attending: Emergency Medicine | Admitting: Emergency Medicine

## 2011-11-27 DIAGNOSIS — E860 Dehydration: Principal | ICD-10-CM | POA: Insufficient documentation

## 2011-11-27 DIAGNOSIS — E785 Hyperlipidemia, unspecified: Secondary | ICD-10-CM | POA: Insufficient documentation

## 2011-11-27 DIAGNOSIS — F411 Generalized anxiety disorder: Secondary | ICD-10-CM | POA: Insufficient documentation

## 2011-11-27 DIAGNOSIS — R110 Nausea: Secondary | ICD-10-CM | POA: Insufficient documentation

## 2011-11-27 DIAGNOSIS — K219 Gastro-esophageal reflux disease without esophagitis: Secondary | ICD-10-CM | POA: Insufficient documentation

## 2011-11-27 DIAGNOSIS — R109 Unspecified abdominal pain: Secondary | ICD-10-CM | POA: Insufficient documentation

## 2011-11-27 DIAGNOSIS — E876 Hypokalemia: Secondary | ICD-10-CM | POA: Insufficient documentation

## 2011-11-27 DIAGNOSIS — F3289 Other specified depressive episodes: Secondary | ICD-10-CM | POA: Insufficient documentation

## 2011-11-27 DIAGNOSIS — F329 Major depressive disorder, single episode, unspecified: Secondary | ICD-10-CM | POA: Insufficient documentation

## 2011-11-27 DIAGNOSIS — I10 Essential (primary) hypertension: Secondary | ICD-10-CM | POA: Insufficient documentation

## 2011-11-27 DIAGNOSIS — M129 Arthropathy, unspecified: Secondary | ICD-10-CM | POA: Insufficient documentation

## 2011-11-27 NOTE — ED Notes (Signed)
PT. REPORTS LEFT UPPER MOLAR PAIN FOR SEVERAL DAYS .

## 2011-11-27 NOTE — ED Notes (Signed)
PT. REPORTS LOW ABDOMINAL PAIN WITH NAUSEA AND DIARRHEA ONSET LAST Thursday , DENIES FEVER OR CHILLS.

## 2011-11-28 LAB — CBC
HCT: 39.7 % (ref 36.0–46.0)
HCT: 39.9 % (ref 36.0–46.0)
Hemoglobin: 14.3 g/dL (ref 12.0–15.0)
Hemoglobin: 14.3 g/dL (ref 12.0–15.0)
MCH: 31.9 pg (ref 26.0–34.0)
MCHC: 35.8 g/dL (ref 30.0–36.0)
MCV: 88.2 fL (ref 78.0–100.0)
MCV: 89.1 fL (ref 78.0–100.0)
RDW: 12.5 % (ref 11.5–15.5)
RDW: 12.5 % (ref 11.5–15.5)
WBC: 8.2 10*3/uL (ref 4.0–10.5)

## 2011-11-28 LAB — POCT I-STAT, CHEM 8
Chloride: 105 mEq/L (ref 96–112)
HCT: 41 % (ref 36.0–46.0)
Hemoglobin: 13.9 g/dL (ref 12.0–15.0)
Potassium: 3.3 mEq/L — ABNORMAL LOW (ref 3.5–5.1)
Sodium: 140 mEq/L (ref 135–145)

## 2011-11-28 LAB — HEPATIC FUNCTION PANEL
AST: 25 U/L (ref 0–37)
Bilirubin, Direct: 0.1 mg/dL (ref 0.0–0.3)
Indirect Bilirubin: 0.5 mg/dL (ref 0.3–0.9)
Total Protein: 8.5 g/dL — ABNORMAL HIGH (ref 6.0–8.3)

## 2011-11-28 LAB — BASIC METABOLIC PANEL
BUN: 14 mg/dL (ref 6–23)
CO2: 22 mEq/L (ref 19–32)
Calcium: 10.8 mg/dL — ABNORMAL HIGH (ref 8.4–10.5)
Chloride: 98 mEq/L (ref 96–112)
Creatinine, Ser: 1.13 mg/dL — ABNORMAL HIGH (ref 0.50–1.10)
Glucose, Bld: 112 mg/dL — ABNORMAL HIGH (ref 70–99)

## 2011-11-28 LAB — URINALYSIS, ROUTINE W REFLEX MICROSCOPIC
Glucose, UA: NEGATIVE mg/dL
Ketones, ur: NEGATIVE mg/dL
Leukocytes, UA: NEGATIVE
Protein, ur: 100 mg/dL — AB
pH: 8 (ref 5.0–8.0)

## 2011-11-28 LAB — DIFFERENTIAL
Eosinophils Relative: 1 % (ref 0–5)
Lymphocytes Relative: 23 % (ref 12–46)
Lymphs Abs: 1.9 10*3/uL (ref 0.7–4.0)
Monocytes Absolute: 0.7 10*3/uL (ref 0.1–1.0)
Monocytes Relative: 9 % (ref 3–12)
Neutro Abs: 5.6 10*3/uL (ref 1.7–7.7)

## 2011-11-28 LAB — URINE MICROSCOPIC-ADD ON

## 2011-11-28 MED ORDER — GI COCKTAIL ~~LOC~~: 30.0000 mL | ORAL | Status: DC | PRN

## 2011-11-28 MED ORDER — SODIUM CHLORIDE 0.9 % IV SOLN
INTRAVENOUS | Status: DC
  Administered 2011-11-28: 01:00:00 via INTRAVENOUS

## 2011-11-28 MED ORDER — SODIUM CHLORIDE 0.9 % IV SOLN
1000.0000 mL | INTRAVENOUS | Status: DC
  Administered 2011-11-28 (×2): 1000 mL via INTRAVENOUS

## 2011-11-28 MED ORDER — ONDANSETRON HCL 4 MG/2ML IJ SOLN
4.0000 mg | Freq: Once | INTRAMUSCULAR | Status: AC
  Administered 2011-11-28: 4 mg via INTRAVENOUS
  Filled 2011-11-28: qty 2

## 2011-11-28 MED ORDER — MAGNESIUM HYDROXIDE 400 MG/5ML PO SUSP: 30.0000 mL | Freq: Two times a day (BID) | ORAL | Status: DC | PRN

## 2011-11-28 MED ORDER — ACETAMINOPHEN 325 MG PO TABS: 650.0000 mg | ORAL_TABLET | ORAL | Status: DC | PRN

## 2011-11-28 MED ORDER — DILTIAZEM HCL ER COATED BEADS 300 MG PO CP24
300.0000 mg | ORAL_CAPSULE | Freq: Every day | ORAL | Status: DC
  Filled 2011-11-28 (×2): qty 1

## 2011-11-28 MED ORDER — POTASSIUM CHLORIDE 10 MEQ/100ML IV SOLN
10.0000 meq | Freq: Once | INTRAVENOUS | Status: AC
  Filled 2011-11-28: qty 100

## 2011-11-28 MED ORDER — POTASSIUM CHLORIDE 10 MEQ/100ML IV SOLN
10.0000 meq | INTRAVENOUS | Status: AC
  Administered 2011-11-28 (×4): 10 meq via INTRAVENOUS
  Filled 2011-11-28 (×3): qty 100

## 2011-11-28 MED ORDER — POTASSIUM CHLORIDE ER 10 MEQ PO TBCR: EXTENDED_RELEASE_TABLET | ORAL | Status: DC

## 2011-11-28 MED ORDER — TRIAMTERENE-HCTZ 75-50 MG PO TABS
1.0000 | ORAL_TABLET | Freq: Every day | ORAL | Status: DC
  Filled 2011-11-28 (×2): qty 1

## 2011-11-28 MED ORDER — ONDANSETRON HCL 4 MG/2ML IJ SOLN
4.0000 mg | Freq: Four times a day (QID) | INTRAMUSCULAR | Status: DC | PRN
  Administered 2011-11-28: 4 mg via INTRAVENOUS
  Filled 2011-11-28: qty 2

## 2011-11-28 MED ORDER — POTASSIUM CHLORIDE CRYS ER 20 MEQ PO TBCR
20.0000 meq | EXTENDED_RELEASE_TABLET | Freq: Once | ORAL | Status: AC
  Administered 2011-11-28: 20 meq via ORAL
  Filled 2011-11-28: qty 1

## 2011-11-28 NOTE — Discharge Instructions (Signed)
Take potassium supplement as directed and follow up with your primary care provider in one week for recheck of potassium and for recheck of your eating and drinking. It is VERY important to stay well hydrated throughout the day with a recommended 8 glass of 8 ounces of water daily. You may sip on water throughout the day to stay well hydrated. Consider nutrition supplement shakes such as Ensure to add nutrients to your diet if you feel like your appetite is decreased to solid food. Return to ER for any emergent changing or worsening of symptoms.   Dehydration, Elderly Dehydration is when you lose more fluids from the body than you take in. Vital organs such as the kidneys, brain, and heart cannot function without a proper amount of fluids and salt. Any loss of fluids from the body can cause dehydration.  Older adults are at a higher risk of dehydration than younger adults. As we age, our bodies are less able to conserve water and do not respond to temperature changes as well. Also, older adults do not become thirsty as easily or quickly. Because of this, older adults often do not realize they need to increase fluids to avoid dehydration.  CAUSES   Vomiting.   Diarrhea.   Excessive sweating.   Excessive urine output.   Fever.  SYMPTOMS  Mild dehydration  Thirst.   Dry lips.   Slightly dry mouth.  Moderate dehydration  Very dry mouth.   Sunken eyes.   Skin does not bounce back quickly when lightly pinched and released.   Dark urine and decreased urine production.   Decreased tear production.   Headache.  Severe dehydration  Very dry mouth.   Extreme thirst.   Rapid, weak pulse (more than 100 beats per minute at rest).   Cold hands and feet.   Not able to sweat in spite of heat.   Rapid breathing.   Blue lips.   Confusion and lethargy.   Difficulty being awakened.   Minimal urine production.   No tears.  DIAGNOSIS  Your caregiver will diagnose dehydration  based on your symptoms and your exam. Blood and urine tests will help confirm the diagnosis. The diagnostic evaluation should also identify the cause of dehydration. TREATMENT  Treatment of mild or moderate dehydration can often be done at home by increasing the amount of fluids that you drink. It is best to drink small amounts of fluid more often. Drinking too much at one time can make vomiting worse.  Severe dehydration needs to be treated at the hospital where you will probably be given intravenous (IV) fluids that contain water and electrolytes. HOME CARE INSTRUCTIONS   Ask your caregiver about specific rehydration instructions.   Drink enough fluids to keep your urine clear or pale yellow.   Drink small amounts frequently if you have nausea and vomiting.   Eat as you normally do.   Avoid:   Foods or drinks high in sugar.   Carbonated drinks.   Juice.   Extremely hot or cold fluids.   Drinks with caffeine.   Fatty, greasy foods.   Alcohol.   Tobacco.   Overeating.   Gelatin desserts.   Wash your hands well to avoid spreading bacteria and viruses.   Only take over-the-counter or prescription medicines for pain, discomfort, or fever as directed by your caregiver.   Ask your caregiver if you should continue all prescribed and over-the-counter medicines.   Keep all follow-up appointments with your caregiver.  SEEK MEDICAL CARE  IF:  You have abdominal pain and it increases or stays in one area (localizes).   You have a rash, stiff neck, or severe headache.   You are irritable, sleepy, or difficult to awaken.   You are weak, dizzy, or extremely thirsty.  SEEK IMMEDIATE MEDICAL CARE IF:   You are unable to keep fluids down, or you get worse despite treatment.   You have frequent episodes of vomiting or diarrhea.   You have blood or green matter (bile) in your vomit.   You have blood in your stool or your stool looks black and tarry.   You have not urinated  in 6 to 8 hours, or you have only urinated a small amount of very dark urine.   You have a fever.   You faint.  MAKE SURE YOU:   Understand these instructions.   Will watch your condition.   Will get help right away if you are not doing well or get worse.  Document Released: 09/23/2003 Document Revised: 06/22/2011 Document Reviewed: 02/20/2011 [facility] Patient Information 2012 [place], [place].

## 2011-11-28 NOTE — ED Provider Notes (Signed)
Medical screening examination/treatment/procedure(s) were performed by non-physician practitioner and as supervising physician I was immediately available for consultation/collaboration.   Sunnie Nielsen, MD 11/28/11 [phone]

## 2011-11-28 NOTE — ED Provider Notes (Signed)
History     CSN: [id number]  Arrival date & time 11/27/11  2231   First MD Initiated Contact with Patient 11/28/11 0026      Chief Complaint  Patient presents with  . Abdominal Pain    (Consider location/radiation/quality/duration/timing/severity/associated sxs/prior treatment) HPI History provided by patient and daughter bedside. Not feeling well for the last 5 days with nausea and decreased appetite. Patient denies any fevers or chills. She denies any vomiting this time frame. She did develop diarrhea this morning. No blood in stools. No black or tarry stools. She denies any abdominal pain or discomfort. No difficulty urinating. No urgency or frequency. No known sick contacts. No rashes. No recent change in medications. [facility] followed [facility]. Moderate in severity. No history of same. Past Medical History  Diagnosis Date  . Anxiety   . Arthritis   . Cataract     had surgery  . Depression   . GERD (gastroesophageal reflux disease)   . Heart murmur   . Irregular heart beat     doesn't know type  . Hyperlipidemia   . Hypertension     Past Surgical History  Procedure Date  . Shoulder arthroscopy     twice on right  . Hip surgery     replacement, left  . Replacement total knee     right  . Foot fracture surgery     at ankle, left    Family History  Problem Relation Age of Onset  . Heart disease Father   . Esophageal cancer Sister     History  Substance Use Topics  . Smoking status: Never Smoker   . Smokeless tobacco: Never Used  . Alcohol Use: No    OB History    Grav Para Term Preterm Abortions TAB SAB Ect Mult Living                  Review of Systems  Constitutional: Negative for fever and chills.  HENT: Negative for neck pain and neck stiffness.   Eyes: Negative for pain.  Respiratory: Negative for cough and shortness of breath.   Cardiovascular: Negative for chest pain.  Gastrointestinal: Positive for nausea. Negative for abdominal pain,  constipation and abdominal distention.  Genitourinary: Negative for dysuria.  Musculoskeletal: Negative for back pain.  Skin: Negative for rash.  Neurological: Negative for headaches.  All other systems reviewed and are negative.    Allergies  Penicillins and Latex  Home Medications   Current Outpatient Rx  Name Route Sig Dispense Refill  . ASPIRIN 81 MG PO TABS Oral Take 81 mg by mouth daily.      . ATORVASTATIN CALCIUM 20 MG PO TABS Oral Take 20 mg by mouth daily.      Marland Kitchen CLARITHROMYCIN 500 MG PO TABS Oral Take 1 tablet (500 mg total) by mouth 2 (two) times daily. 20 tablet 0  . DILTIAZEM HCL ER COATED BEADS 300 MG PO CP24 Oral Take 300 mg by mouth daily.    . ETODOLAC 400 MG PO TABS Oral Take 400 mg by mouth 2 (two) times daily.      Marland Kitchen GABAPENTIN 100 MG PO CAPS Oral Take 300 mg by mouth 3 (three) times daily.     Marland Kitchen GLUCOSAMINE CHONDR COMPLEX PO Oral Take 1 tablet by mouth 2 (two) times daily.    Marland Kitchen HYDROCODONE-ACETAMINOPHEN 5-325 MG PO TABS Oral Take 1 tablet by mouth every 4 (four) hours as needed. For pain    . IBUPROFEN 200  MG PO TABS Oral Take 200 mg by mouth as needed.    Marland Kitchen METRONIDAZOLE 250 MG PO TABS Oral Take 250 mg by mouth 4 (four) times daily.    Marland Kitchen OMEPRAZOLE 20 MG PO CPDR Oral Take 1 capsule (20 mg total) by mouth 2 (two) times daily. Pt to take twice daily for 10 days then back to one daily 45 capsule 11  . POTASSIUM CHLORIDE 10 MEQ PO TBCR Oral Take 20 mEq by mouth daily.      . TRIAMTERENE-HCTZ 75-50 MG PO TABS Oral Take 1 tablet by mouth daily.        BP 178/98  Pulse 85  Temp(Src) 98.2 F (36.8 C) (Oral)  Resp 18  SpO2 94%  Physical Exam  Constitutional: She is oriented to person, place, and time. She appears well-developed and well-nourished.  HENT:  Head: Normocephalic and atraumatic.       Dry mucous membranes  Eyes: Conjunctivae and EOM are normal. Pupils are equal, round, and reactive to light.  Neck: Trachea normal. Neck supple. No thyromegaly  present.  Cardiovascular: Normal rate, regular rhythm, S1 normal, S2 normal and normal pulses.     No systolic murmur is present   No diastolic murmur is present  Pulses:      Radial pulses are 2+ on the right side, and 2+ on the left side.  Pulmonary/Chest: Effort normal and breath sounds normal. She has no wheezes. She has no rhonchi. She has no rales. She exhibits no tenderness.  Abdominal: Soft. Normal appearance and bowel sounds are normal. There is no tenderness. There is no CVA tenderness and negative Murphy's sign.       No tenderness or peritonitis with deep palpation throughout abdomen  Musculoskeletal:       BLE:s Calves nontender, no cords or erythema, negative Homans sign  Neurological: She is alert and oriented to person, place, and time. She has normal strength. No cranial nerve deficit or sensory deficit. GCS eye subscore is 4. GCS verbal subscore is 5. GCS motor subscore is 6.  Skin: Skin is warm and dry. No rash noted. She is not diaphoretic.  Psychiatric: Her speech is normal.       Cooperative and appropriate    ED Course  Procedures (including critical care time)  Results for orders placed during the hospital encounter of 11/27/11  CBC      Component Value Range   WBC 8.2  4.0 - 10.5 (K/uL)   RBC 4.50  3.87 - 5.11 (MIL/uL)   Hemoglobin 14.3  12.0 - 15.0 (g/dL)   HCT 72.0  94.7 - 09.6 (%)   MCV 88.2  78.0 - 100.0 (fL)   MCH 31.8  26.0 - 34.0 (pg)   MCHC 36.0  30.0 - 36.0 (g/dL)   RDW 28.3  66.2 - 94.7 (%)   Platelets 171  150 - 400 (K/uL)  DIFFERENTIAL      Component Value Range   Neutrophils Relative 68  43 - 77 (%)   Neutro Abs 5.6  1.7 - 7.7 (K/uL)   Lymphocytes Relative 23  12 - 46 (%)   Lymphs Abs 1.9  0.7 - 4.0 (K/uL)   Monocytes Relative 9  3 - 12 (%)   Monocytes Absolute 0.7  0.1 - 1.0 (K/uL)   Eosinophils Relative 1  0 - 5 (%)   Eosinophils Absolute 0.0  0.0 - 0.7 (K/uL)   Basophils Relative 0  0 - 1 (%)   Basophils Absolute 0.0  0.0 - 0.1  (K/uL)  BASIC METABOLIC PANEL      Component Value Range   Sodium 137  135 - 145 (mEq/L)   Potassium 2.6 (*) 3.5 - 5.1 (mEq/L)   Chloride 98  96 - 112 (mEq/L)   CO2 22  19 - 32 (mEq/L)   Glucose, Bld 112 (*) 70 - 99 (mg/dL)   BUN 14  6 - 23 (mg/dL)   Creatinine, Ser 4.09 (*) 0.50 - 1.10 (mg/dL)   Calcium 81.1 (*) 8.4 - 10.5 (mg/dL)   GFR calc non Af Amer 45 (*) >90 (mL/min)   GFR calc Af Amer 52 (*) >90 (mL/min)  CBC      Component Value Range   WBC 8.1  4.0 - 10.5 (K/uL)   RBC 4.48  3.87 - 5.11 (MIL/uL)   Hemoglobin 14.3  12.0 - 15.0 (g/dL)   HCT 91.4  78.2 - 95.6 (%)   MCV 89.1  78.0 - 100.0 (fL)   MCH 31.9  26.0 - 34.0 (pg)   MCHC 35.8  30.0 - 36.0 (g/dL)   RDW 21.3  08.6 - 57.8 (%)   Platelets 171  150 - 400 (K/uL)  LIPASE, BLOOD      Component Value Range   Lipase 73 (*) 11 - 59 (U/L)  URINALYSIS, ROUTINE W REFLEX MICROSCOPIC      Component Value Range   Color, Urine YELLOW  YELLOW    APPearance CLEAR  CLEAR    Specific Gravity, Urine 1.011  1.005 - 1.030    pH 8.0  5.0 - 8.0    Glucose, UA NEGATIVE  NEGATIVE (mg/dL)   Hgb urine dipstick SMALL (*) NEGATIVE    Bilirubin Urine NEGATIVE  NEGATIVE    Ketones, ur NEGATIVE  NEGATIVE (mg/dL)   Protein, ur 469 (*) NEGATIVE (mg/dL)   Urobilinogen, UA 0.2  0.0 - 1.0 (mg/dL)   Nitrite NEGATIVE  NEGATIVE    Leukocytes, UA NEGATIVE  NEGATIVE   URINE MICROSCOPIC-ADD ON      Component Value Range   Squamous Epithelial / LPF RARE  RARE    WBC, UA 0-2  <3 (WBC/hpf)   RBC / HPF 0-2  <3 (RBC/hpf)   Bacteria, UA RARE  RARE     Date: 11/28/2011  Rate: 74  Rhythm: normal sinus rhythm  QRS Axis: left  Intervals: normal  ST/T Wave abnormalities: nonspecific ST changes  Conduction Disutrbances:none  Narrative Interpretation: Low voltage  Old EKG Reviewed: none available    IV fluids provided. UA, labs obtained and reviewed as above.   MDM   Elderly female with nausea and food eversion  On further evaluation, patient  admits that she has not had any daily meds in the last 24 hours and has not been drinking either. She is clinically dehydrated with hypokalemia as above. She is placed on CDU observation dehydration protocol and multiple runs of potassium initiated.   Recheck at 3:31 AM- is improving patient requesting something to eat and drink. Meal and fluids provided. As noted her blood pressure is elevated and her home medications were ordered.        Sunnie Nielsen, MD 11/28/11 [phone]

## 2011-11-28 NOTE — ED Provider Notes (Signed)
Patient was placed on CDU dehydration protocol by Dr. Dierdre Highman. She is resting comfortably in bed with no complaints stating she feels like she can eat some breakfast. She is finishing IV potassium and we will recheck. Her BP has improved after her BP medications were given. She denies nausea, vomiting, abdominal pain. Afebrile. Ambulating to bathroom without difficulty.   Lenon Oms [place], [place] 11/28/11 [phone]

## 2011-11-28 NOTE — ED Notes (Signed)
Pt c/o lower abd pain, N/D starting Thursday. Pt also reports dizziness starting Sunday. Pt denies chest pain, sob, or vomiting

## 2012-01-04 ENCOUNTER — Telehealth: Payer: Self-pay | Admitting: Obstetrics and Gynecology

## 2012-03-10 ENCOUNTER — Encounter (HOSPITAL_COMMUNITY): Payer: Self-pay | Admitting: Emergency Medicine

## 2012-03-10 ENCOUNTER — Emergency Department (HOSPITAL_COMMUNITY)
Admission: EM | Admit: 2012-03-10 | Discharge: 2012-03-10 | Disposition: A | Discharge status: Home / Self Care | Payer: Medicare Other | Attending: Emergency Medicine | Admitting: Emergency Medicine

## 2012-03-10 ENCOUNTER — Emergency Department (HOSPITAL_COMMUNITY): Payer: Medicare Other

## 2012-03-10 DIAGNOSIS — F3289 Other specified depressive episodes: Secondary | ICD-10-CM | POA: Insufficient documentation

## 2012-03-10 DIAGNOSIS — F329 Major depressive disorder, single episode, unspecified: Secondary | ICD-10-CM | POA: Insufficient documentation

## 2012-03-10 DIAGNOSIS — I10 Essential (primary) hypertension: Secondary | ICD-10-CM | POA: Insufficient documentation

## 2012-03-10 DIAGNOSIS — Z7982 Long term (current) use of aspirin: Secondary | ICD-10-CM | POA: Insufficient documentation

## 2012-03-10 DIAGNOSIS — E785 Hyperlipidemia, unspecified: Secondary | ICD-10-CM | POA: Insufficient documentation

## 2012-03-10 DIAGNOSIS — K219 Gastro-esophageal reflux disease without esophagitis: Secondary | ICD-10-CM

## 2012-03-10 DIAGNOSIS — Z8739 Personal history of other diseases of the musculoskeletal system and connective tissue: Secondary | ICD-10-CM | POA: Insufficient documentation

## 2012-03-10 DIAGNOSIS — Z79899 Other long term (current) drug therapy: Secondary | ICD-10-CM | POA: Insufficient documentation

## 2012-03-10 DIAGNOSIS — E860 Dehydration: Secondary | ICD-10-CM

## 2012-03-10 DIAGNOSIS — F411 Generalized anxiety disorder: Secondary | ICD-10-CM | POA: Insufficient documentation

## 2012-03-10 LAB — COMPREHENSIVE METABOLIC PANEL
Albumin: 4.2 g/dL (ref 3.5–5.2)
Alkaline Phosphatase: 68 U/L (ref 39–117)
BUN: 24 mg/dL — ABNORMAL HIGH (ref 6–23)
CO2: 26 mEq/L (ref 19–32)
Chloride: 100 mEq/L (ref 96–112)
GFR calc Af Amer: 33 mL/min — ABNORMAL LOW (ref >90)
GFR calc non Af Amer: 29 mL/min — ABNORMAL LOW (ref >90)
Glucose, Bld: 114 mg/dL — ABNORMAL HIGH (ref 70–99)
Potassium: 3.4 mEq/L — ABNORMAL LOW (ref 3.5–5.1)
Total Bilirubin: 0.7 mg/dL (ref 0.3–1.2)

## 2012-03-10 LAB — CBC WITH DIFFERENTIAL/PLATELET
Basophils Absolute: 0 10*3/uL (ref 0.0–0.1)
Basophils Relative: 0 % (ref 0–1)
Eosinophils Absolute: 0.1 10*3/uL (ref 0.0–0.7)
Eosinophils Relative: 1 % (ref 0–5)
HCT: 36.5 % (ref 36.0–46.0)
Hemoglobin: 13.3 g/dL (ref 12.0–15.0)
Lymphocytes Relative: 30 % (ref 12–46)
Lymphs Abs: 2 10*3/uL (ref 0.7–4.0)
MCH: 32.5 pg (ref 26.0–34.0)
MCHC: 36.4 g/dL — ABNORMAL HIGH (ref 30.0–36.0)
MCV: 89.2 fL (ref 78.0–100.0)
Monocytes Absolute: 0.4 10*3/uL (ref 0.1–1.0)
Monocytes Relative: 6 % (ref 3–12)
Neutro Abs: 4.1 10*3/uL (ref 1.7–7.7)
Neutrophils Relative %: 63 % (ref 43–77)
Platelets: 156 10*3/uL (ref 150–400)
RBC: 4.09 MIL/uL (ref 3.87–5.11)
RDW: 12.6 % (ref 11.5–15.5)
WBC: 6.5 10*3/uL (ref 4.0–10.5)

## 2012-03-10 LAB — URINALYSIS, ROUTINE W REFLEX MICROSCOPIC
Bilirubin Urine: NEGATIVE
Glucose, UA: NEGATIVE mg/dL
Ketones, ur: NEGATIVE mg/dL
Leukocytes, UA: NEGATIVE
Nitrite: NEGATIVE
Protein, ur: NEGATIVE mg/dL

## 2012-03-10 LAB — POCT I-STAT TROPONIN I

## 2012-03-10 MED ORDER — SODIUM CHLORIDE 0.9 % IV BOLUS (SEPSIS)
500.0000 mL | Freq: Once | INTRAVENOUS | Status: AC
  Administered 2012-03-10: 500 mL via INTRAVENOUS

## 2012-03-10 MED ORDER — PANTOPRAZOLE SODIUM 40 MG IV SOLR
40.0000 mg | Freq: Once | INTRAVENOUS | Status: AC
  Administered 2012-03-10: 40 mg via INTRAVENOUS
  Filled 2012-03-10: qty 40

## 2012-03-10 MED ORDER — GI COCKTAIL ~~LOC~~
30.0000 mL | Freq: Once | ORAL | Status: AC
  Administered 2012-03-10: 30 mL via ORAL
  Filled 2012-03-10: qty 30

## 2012-03-10 NOTE — ED Notes (Addendum)
Patient reports that she is "sick"; when asking patient if she is in pain, patient denies.  Patient rubs stomach when asking assessment questions; states that she is also having GERD-like symptoms.  Denies nausea, vomiting, and diarrhea.  Patient not able to elaborate on symptoms; seems agitated when asking questions to patient.

## 2012-03-10 NOTE — ED Provider Notes (Addendum)
History     CSN: [id number]  Arrival date & time 03/10/12  0335   First MD Initiated Contact with Patient 03/10/12 [phone]      Chief Complaint  Patient presents with  . Abdominal Pain    (Consider location/radiation/quality/duration/timing/severity/associated sxs/prior treatment) HPI Pt c/o several days of felling "sick". States that she has had nausea and epigastric pain, with throat "congestion". Pt states she has a history of GERD. No fever, chills, CP, SOB, cough, urinary symptoms, V/D/C.  Past Medical History  Diagnosis Date  . Anxiety   . Arthritis   . Cataract     had surgery  . Depression   . GERD (gastroesophageal reflux disease)   . Heart murmur   . Irregular heart beat     doesn't know type  . Hyperlipidemia   . Hypertension     Past Surgical History  Procedure Date  . Shoulder arthroscopy     twice on right  . Hip surgery     replacement, left  . Replacement total knee     right  . Foot fracture surgery     at ankle, left    Family History  Problem Relation Age of Onset  . Heart disease Father   . Esophageal cancer Sister     History  Substance Use Topics  . Smoking status: Never Smoker   . Smokeless tobacco: Never Used  . Alcohol Use: No    OB History    Grav Para Term Preterm Abortions TAB SAB Ect Mult Living                  Review of Systems  Constitutional: Positive for fatigue. Negative for fever and chills.  HENT: Negative for neck pain.   Respiratory: Negative for shortness of breath, wheezing and stridor.   Cardiovascular: Negative for chest pain, palpitations and leg swelling.  Gastrointestinal: Positive for nausea and abdominal pain. Negative for vomiting, diarrhea, constipation and blood in stool.  Genitourinary: Negative for dysuria and frequency.  Musculoskeletal: Negative for back pain.  Skin: Negative for pallor, rash and wound.  Neurological: Negative for dizziness, weakness, light-headedness, numbness and headaches.      Allergies  Penicillins and Latex  Home Medications   Current Outpatient Rx  Name Route Sig Dispense Refill  . ASPIRIN 81 MG PO TABS Oral Take 81 mg by mouth daily.      . ATORVASTATIN CALCIUM 20 MG PO TABS Oral Take 20 mg by mouth daily.      Marland Kitchen BISMUTH SUBSALICYLATE 262 MG PO CHEW Oral Chew 524 mg by mouth as needed. Upset stomach    . DILTIAZEM HCL ER COATED BEADS 300 MG PO CP24 Oral Take 300 mg by mouth daily.    . ETODOLAC 400 MG PO TABS Oral Take 400 mg by mouth 2 (two) times daily.      Marland Kitchen GABAPENTIN 300 MG PO CAPS Oral Take 300 mg by mouth 3 (three) times daily.    Marland Kitchen GLUCOSAMINE CHONDR COMPLEX PO Oral Take 1 tablet by mouth 2 (two) times daily.    Marland Kitchen HYDROCODONE-ACETAMINOPHEN 5-325 MG PO TABS Oral Take 0.5-1 tablets by mouth every 4 (four) hours as needed. For pain    . IBUPROFEN 200 MG PO TABS Oral Take 200 mg by mouth every 8 (eight) hours as needed. Knee pain or arthritis    . LORATADINE 10 MG PO TABS Oral Take 10 mg by mouth daily.    Marland Kitchen OMEPRAZOLE 20 MG PO CPDR Oral  Take 1 capsule (20 mg total) by mouth 2 (two) times daily. Pt to take twice daily for 10 days then back to one daily 45 capsule 11  . POTASSIUM CHLORIDE ER 10 MEQ PO TBCR  One tablet by mouth BID 20 tablet 0  . TRIAMTERENE-HCTZ 75-50 MG PO TABS Oral Take 1 tablet by mouth daily.        BP 156/87  Pulse 89  Temp 98.4 F (36.9 C) (Oral)  Resp 16  SpO2 98%  Physical Exam  Nursing note and vitals reviewed. Constitutional: She is oriented to person, place, and time. She appears well-developed and well-nourished. No distress.  HENT:  Head: Normocephalic and atraumatic.  Mouth/Throat: Oropharynx is clear and moist.  Eyes: EOM are normal. Pupils are equal, round, and reactive to light.  Neck: Normal range of motion. Neck supple.  Cardiovascular: Normal rate and regular rhythm.   Pulmonary/Chest: Effort normal and breath sounds normal. No stridor. No respiratory distress. She has no wheezes. She has no rales.  She exhibits no tenderness.  Abdominal: Soft. Bowel sounds are normal. She exhibits no distension and no mass. There is no tenderness. There is no rebound and no guarding.  Musculoskeletal: Normal range of motion. She exhibits no edema and no tenderness.  Neurological: She is alert and oriented to person, place, and time.       5/5 motor, sensation intact  Skin: Skin is warm and dry. No rash noted. No erythema.  Psychiatric: She has a normal mood and affect. Her behavior is normal.    ED Course  Procedures (including critical care time)  Labs Reviewed  CBC WITH DIFFERENTIAL - Abnormal; Notable for the following:    MCHC 36.4 (*)     All other components within normal limits  COMPREHENSIVE METABOLIC PANEL - Abnormal; Notable for the following:    Potassium 3.4 (*)     Glucose, Bld 114 (*)     BUN 24 (*)     Creatinine, Ser 1.62 (*)     Calcium 11.3 (*)     Total Protein 8.5 (*)     GFR calc non Af Amer 29 (*)     GFR calc Af Amer 33 (*)     All other components within normal limits  POCT I-STAT TROPONIN I  URINALYSIS, ROUTINE W REFLEX MICROSCOPIC   Dg Abd Acute W/chest  03/10/2012  *RADIOLOGY REPORT*  Clinical Data: Nausea, abdominal pain.  ACUTE ABDOMEN SERIES (ABDOMEN 2 VIEW & CHEST 1 VIEW)  Comparison: 03/12/2010  Findings: Prominent cardiomediastinal contours.  Aortic arch atherosclerotic calcification.  Postoperative changes of the right shoulder with tendon anchors.  Mild interstitial prominence without focal consolidation.  No free intraperitoneal air identified.  Nonspecific curvilinear metallic density projects over the left sacrum.  Left hip arthroplasty.  Degenerative changes of the lumbar spine. Atherosclerotic vascular calcifications.  No free intraperitoneal air.  Organ outlines normal where seen.  Nonobstructive bowel gas pattern.  IMPRESSION: Nonobstructive bowel gas pattern.   Original Report Authenticated By: Waneta Martins, M.D.      1. GERD (gastroesophageal  reflux disease)   2. Dehydration     Date: 04/21/2012  Rate:80  Rhythm: normal sinus rhythm  QRS Axis: normal  Intervals: normal  ST/T Wave abnormalities: nonspecific T wave changes  Conduction Disutrbances:none  Narrative Interpretation:   Old EKG Reviewed: none available     MDM   Pt states she is feeling much better and is at her baseline. Will d/c home to f/u  with PMD. Encouraged to drink plenty of fluids       Loren Racer, MD 03/10/12 1610  Loren Racer, MD 04/21/12 [phone]

## 2012-06-03 IMAGING — CR DG KNEE 1-2V PORT*R*
2 series · 2 of 2 positions shown · non-contrast
Comparison: None.

CLINICAL DATA: 79-year-old female status post right total knee
arthroplasty.

PORTABLE RIGHT KNEE - 1-2 VIEW

[view not recorded (1 of 2)]
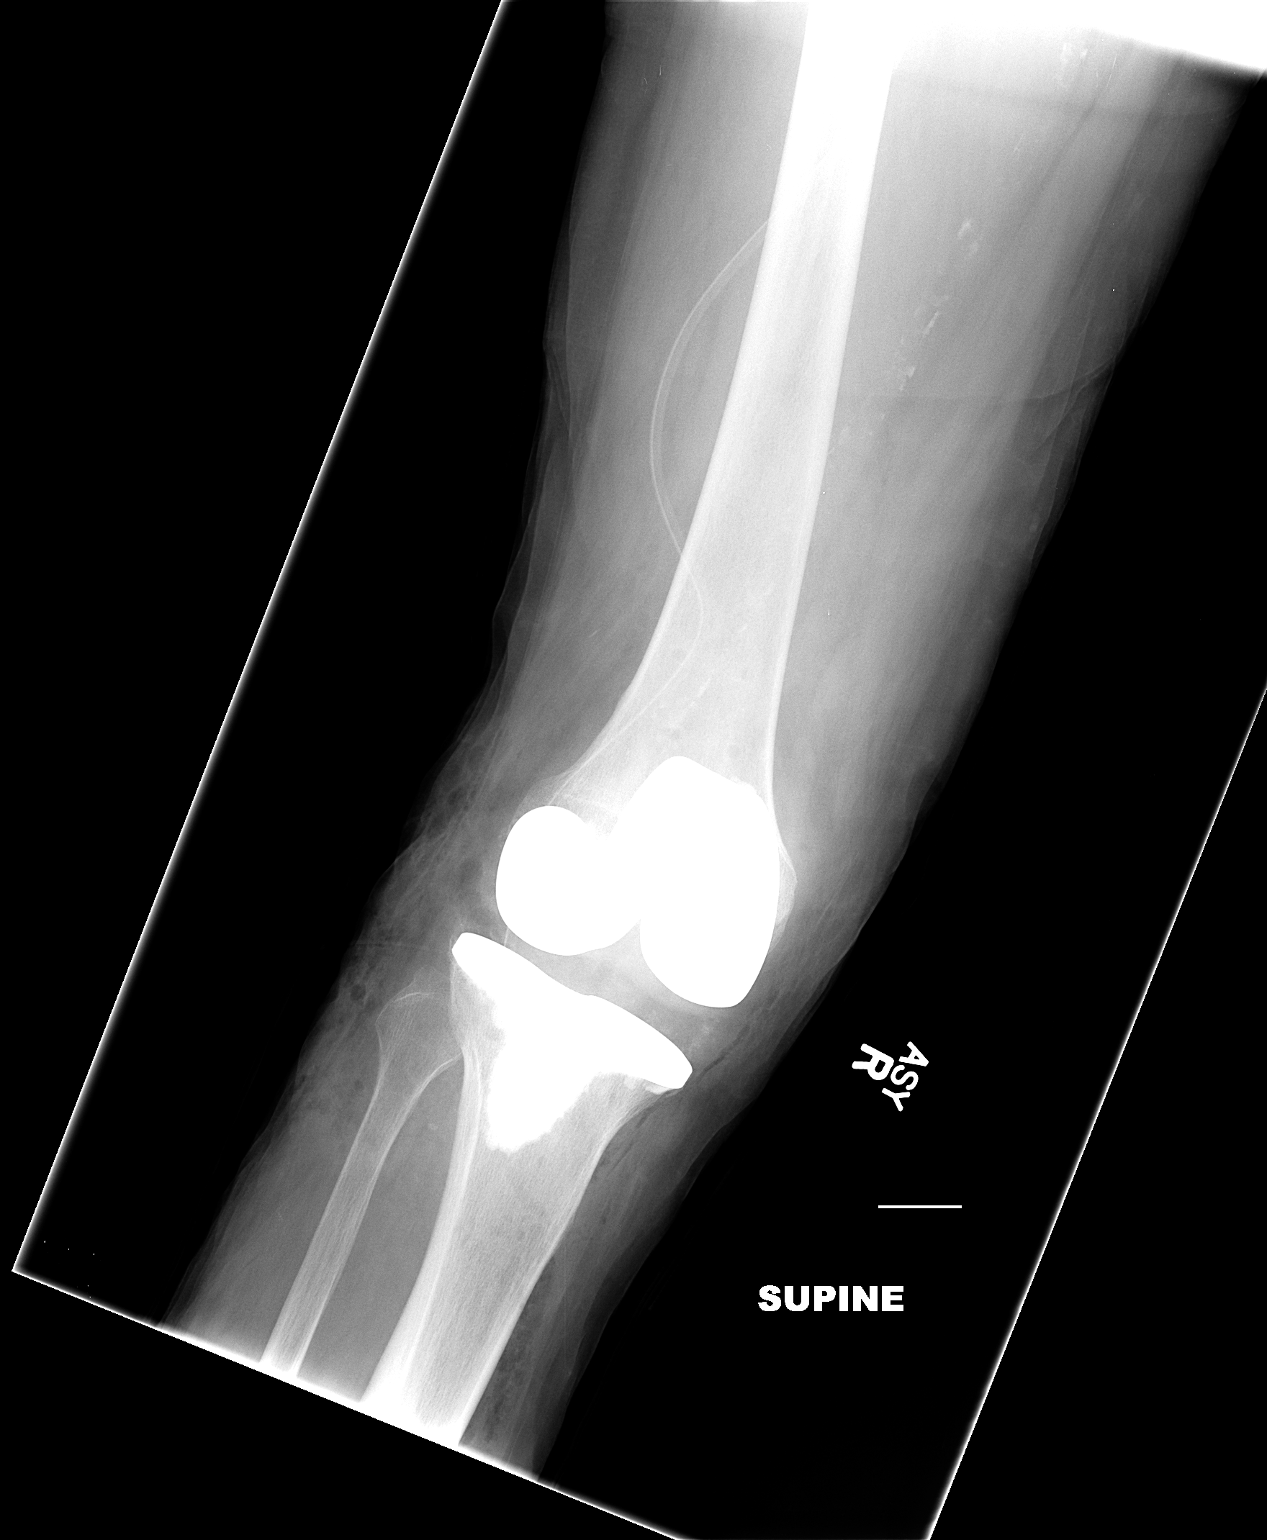

[view not recorded (2 of 2)]
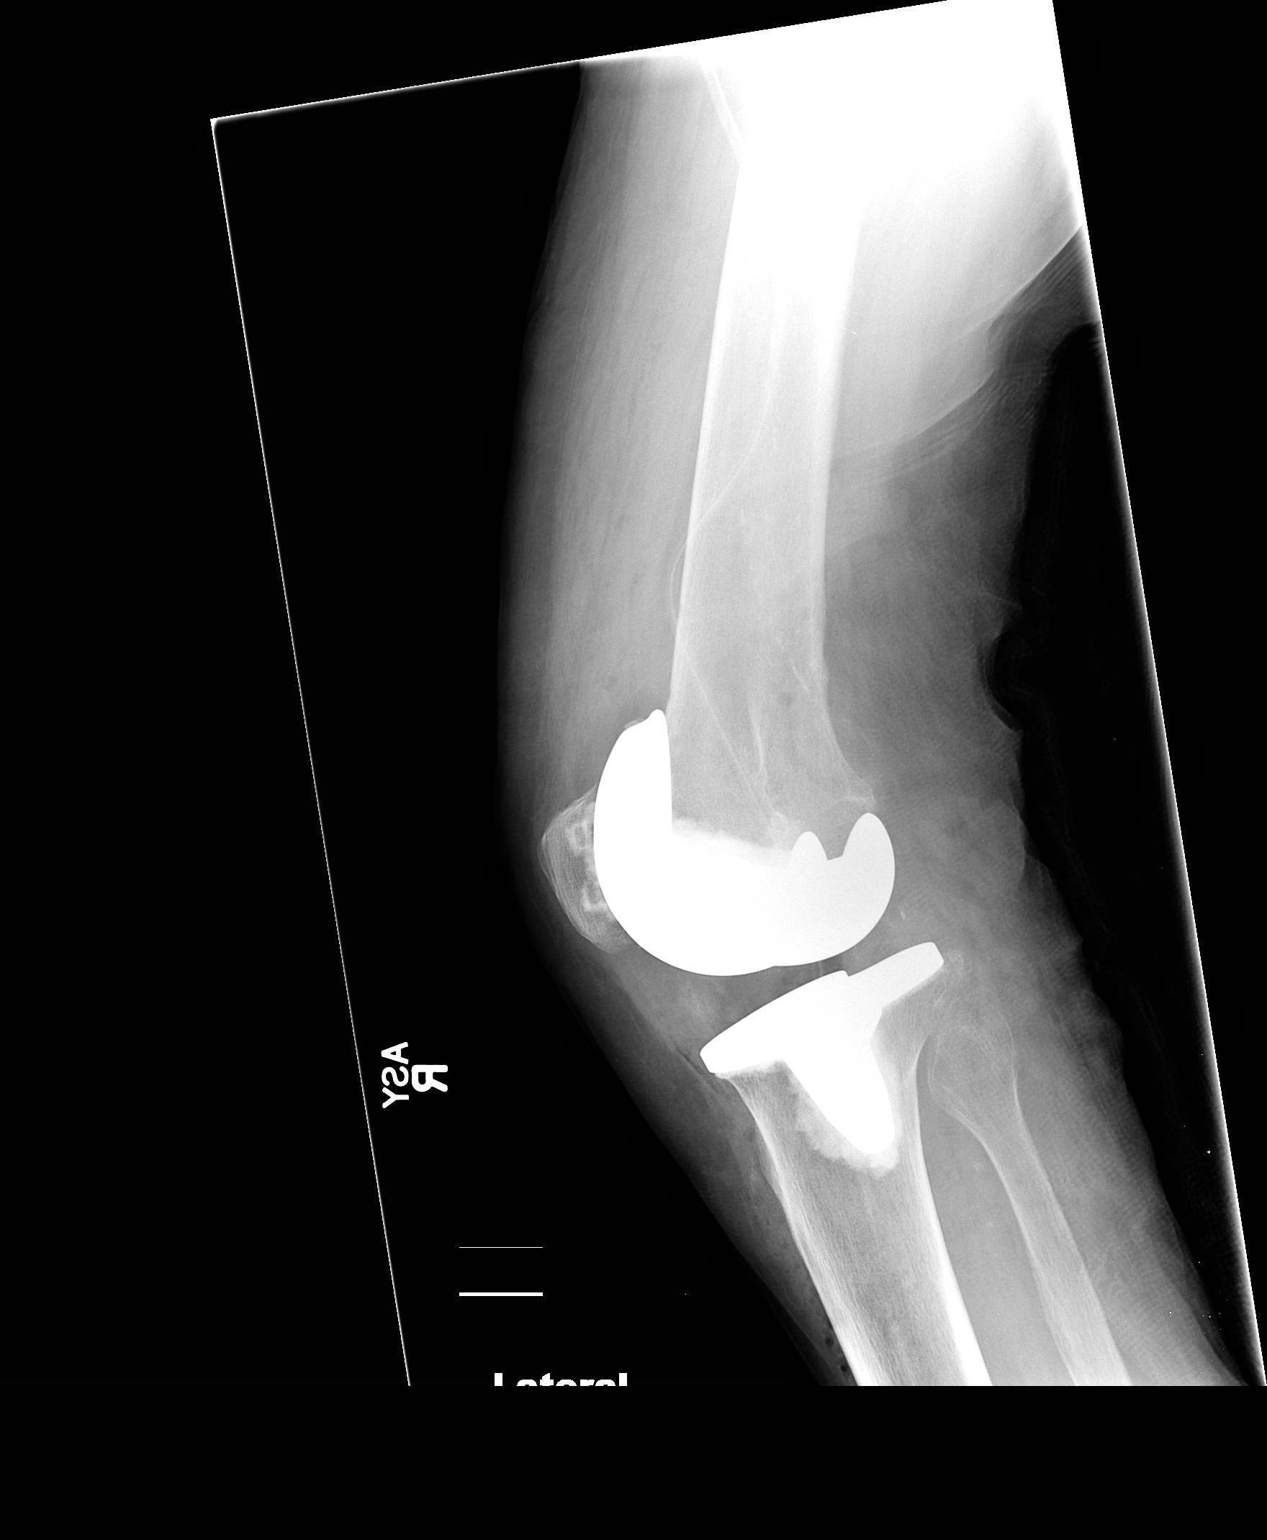

[2 of 2 positions shown; findings below may reference images not displayed]

FINDINGS: Portable supine and lateral views of the right knee 8187
hours.  Cemented total knee arthroplasty components in place.
Alignment appears within normal limits.  Mild soft tissue swelling
and subcutaneous gas about the right knee.  Percutaneous drain in
place.  Calcified atherosclerosis.
IMPRESSION: No adverse features status post right knee arthroplasty.

## 2012-06-05 ENCOUNTER — Encounter (HOSPITAL_COMMUNITY): Payer: Self-pay | Admitting: *Deleted

## 2012-06-05 ENCOUNTER — Emergency Department (HOSPITAL_COMMUNITY): Payer: Medicare Other

## 2012-06-05 ENCOUNTER — Emergency Department (HOSPITAL_COMMUNITY)
Admission: EM | Admit: 2012-06-05 | Discharge: 2012-06-05 | Disposition: A | Discharge status: Home / Self Care | Payer: Medicare Other | Attending: Emergency Medicine | Admitting: Emergency Medicine

## 2012-06-05 DIAGNOSIS — M79604 Pain in right leg: Secondary | ICD-10-CM

## 2012-06-05 DIAGNOSIS — IMO0002 Reserved for concepts with insufficient information to code with codable children: Secondary | ICD-10-CM | POA: Insufficient documentation

## 2012-06-05 DIAGNOSIS — E785 Hyperlipidemia, unspecified: Secondary | ICD-10-CM | POA: Insufficient documentation

## 2012-06-05 DIAGNOSIS — F329 Major depressive disorder, single episode, unspecified: Secondary | ICD-10-CM | POA: Insufficient documentation

## 2012-06-05 DIAGNOSIS — M79609 Pain in unspecified limb: Secondary | ICD-10-CM | POA: Insufficient documentation

## 2012-06-05 DIAGNOSIS — Z7982 Long term (current) use of aspirin: Secondary | ICD-10-CM | POA: Insufficient documentation

## 2012-06-05 DIAGNOSIS — Z8679 Personal history of other diseases of the circulatory system: Secondary | ICD-10-CM | POA: Insufficient documentation

## 2012-06-05 DIAGNOSIS — K219 Gastro-esophageal reflux disease without esophagitis: Secondary | ICD-10-CM | POA: Insufficient documentation

## 2012-06-05 DIAGNOSIS — M7989 Other specified soft tissue disorders: Secondary | ICD-10-CM | POA: Insufficient documentation

## 2012-06-05 DIAGNOSIS — M129 Arthropathy, unspecified: Secondary | ICD-10-CM | POA: Insufficient documentation

## 2012-06-05 DIAGNOSIS — F411 Generalized anxiety disorder: Secondary | ICD-10-CM | POA: Insufficient documentation

## 2012-06-05 DIAGNOSIS — Z79899 Other long term (current) drug therapy: Secondary | ICD-10-CM | POA: Insufficient documentation

## 2012-06-05 DIAGNOSIS — M5416 Radiculopathy, lumbar region: Secondary | ICD-10-CM

## 2012-06-05 DIAGNOSIS — Z8739 Personal history of other diseases of the musculoskeletal system and connective tissue: Secondary | ICD-10-CM | POA: Insufficient documentation

## 2012-06-05 DIAGNOSIS — I10 Essential (primary) hypertension: Secondary | ICD-10-CM | POA: Insufficient documentation

## 2012-06-05 DIAGNOSIS — F3289 Other specified depressive episodes: Secondary | ICD-10-CM | POA: Insufficient documentation

## 2012-06-05 NOTE — ED Notes (Signed)
Pt c/o intermittent pain in right lower leg/ankle pain x 5 days. Pt denies any pain at present.

## 2012-06-05 NOTE — ED Notes (Signed)
Patient states "leg was cold earlier, but better now"

## 2012-06-05 NOTE — ED Notes (Signed)
Good pedal pule to right foot, no edema or swelling noted to LLE

## 2012-06-05 NOTE — ED Provider Notes (Signed)
History     CSN: [id number]  Arrival date & time 06/05/12  4742   First MD Initiated Contact with Patient 06/05/12 [phone]      Chief Complaint  Patient presents with  . Leg Pain    (Consider location/radiation/quality/duration/timing/severity/associated sxs/prior treatment) HPI Brenda Werner is a 78 y.o. female who presents with complaint of right leg pain. States has had pain and swelling in right leg, thigh down yesterday. Pain has been there for about 5 days, pain comes and goes. This morning, states had a sharp pain in right lateral shin, which worried her. Pain resolved, but she came in to get "checked out." Denies fever, chills, malaise. No CP or SOB. No recent falls or injuries.    Past Medical History  Diagnosis Date  . Anxiety   . Arthritis   . Cataract     had surgery  . Depression   . GERD (gastroesophageal reflux disease)   . Heart murmur   . Irregular heart beat     doesn't know type  . Hyperlipidemia   . Hypertension     Past Surgical History  Procedure Date  . Shoulder arthroscopy     twice on right  . Hip surgery     replacement, left  . Replacement total knee     right  . Foot fracture surgery     at ankle, left    Family History  Problem Relation Age of Onset  . Heart disease Father   . Esophageal cancer Sister     History  Substance Use Topics  . Smoking status: Never Smoker   . Smokeless tobacco: Never Used  . Alcohol Use: No    OB History    Grav Para Term Preterm Abortions TAB SAB Ect Mult Living                  Review of Systems  Constitutional: Negative for fever and chills.  HENT: Negative for neck pain and neck stiffness.   Respiratory: Negative.   Cardiovascular: Positive for leg swelling. Negative for chest pain and palpitations.  Gastrointestinal: Negative.   Musculoskeletal: Positive for joint swelling and arthralgias. Negative for back pain.  Skin: Negative for color change.  Neurological: Negative for weakness  and numbness.    Allergies  Penicillins and Latex  Home Medications   Current Outpatient Rx  Name  Route  Sig  Dispense  Refill  . ACETAMINOPHEN 500 MG PO TABS   Oral   Take 500 mg by mouth every 6 (six) hours as needed. For pain         . ASPIRIN EC 81 MG PO TBEC   Oral   Take 81 mg by mouth daily.         . ATORVASTATIN CALCIUM 20 MG PO TABS   Oral   Take 20 mg by mouth every evening.          Marland Kitchen BUPROPION HCL ER (SR) 150 MG PO TB12   Oral   Take 150 mg by mouth every morning.         Marland Kitchen DILTIAZEM HCL ER COATED BEADS 300 MG PO CP24   Oral   Take 300 mg by mouth daily.         Marland Kitchen GABAPENTIN 300 MG PO CAPS   Oral   Take 300 mg by mouth 3 (three) times daily.         Marland Kitchen GLUCOSAMINE CHONDR COMPLEX PO   Oral   Take 1  tablet by mouth 2 (two) times daily.         . OLOPATADINE HCL 0.2 % OP SOLN   Ophthalmic   Apply 1 drop to eye daily.         Marland Kitchen OMEPRAZOLE 20 MG PO CPDR   Oral   Take 20 mg by mouth 2 (two) times daily.         Marland Kitchen POTASSIUM CHLORIDE ER 10 MEQ PO CPCR   Oral   Take 10 mEq by mouth 2 (two) times daily.         . TRIAMTERENE-HCTZ 75-50 MG PO TABS   Oral   Take 1 tablet by mouth every morning.            BP 160/76  Pulse 83  Temp 97.9 F (36.6 C) (Oral)  Resp 20  SpO2 99%  Physical Exam  Nursing note and vitals reviewed. Constitutional: She appears well-developed and well-nourished. No distress.  Eyes: Conjunctivae normal are normal.  Neck: Neck supple.  Cardiovascular: Normal rate, regular rhythm and normal heart sounds.   Pulmonary/Chest: Effort normal and breath sounds normal. No respiratory distress. She has no wheezes. She has no rales.  Musculoskeletal:       Normal appearing right leg. Full ROM with no pain at right hip, right knee, right ankle joints. No swelling, erythema, wounds. Normal dorsal pedal pulses bilaterally. No knee, calf, ankle tenderness Negative homans' sign.   Skin: Skin is warm and dry.    ED  Course  Procedures (including critical care time)  Pt with right leg pain. Normal exam. No swelling. Normal dorsal pedal pulses. I doubt pt has DVT, doubt any fractures at this time due to no injury, no swelling, no pain with movement of the leg. No signs of infection. Discussed with Dr. Rhunette Croft, who recommended right hip x-ray due to new right hip pain.   Dg Hip Complete Right  06/05/2012  *RADIOLOGY REPORT*  Clinical Data: Right hip pain.  RIGHT HIP - COMPLETE 2+ VIEW  Comparison: None.  Findings: Left hip arthroplasty.  Right hip joint space is maintained.  No significant degenerative changes in the right hip. Vascular calcifications.  Intrauterine contraceptive device is seen in the central anatomic pelvis.  Degenerative changes are incidentally imaged in the lower lumbosacral spine.  IMPRESSION: No findings to explain the patient's right hip pain, other than degenerative disc disease.   Original Report Authenticated By: Leanna Battles, M.D.     11:32 AM X-ray negative. Pt stabele for d/c home with outpatient follow up for further evaluation and treatment.    1. Right leg pain   2. Lumbar radiculopathy       MDM          Lottie Mussel, PA 06/05/12 1133

## 2012-06-08 NOTE — ED Provider Notes (Signed)
Medical screening examination/treatment/procedure(s) were conducted as a shared visit with non-physician practitioner(s) and myself.  I personally evaluated the patient during the encounter  Derwood Kaplan, MD 06/08/12 [phone]

## 2012-06-28 ENCOUNTER — Encounter (HOSPITAL_COMMUNITY): Payer: Self-pay | Admitting: Respiratory Therapy

## 2012-07-02 ENCOUNTER — Other Ambulatory Visit (HOSPITAL_COMMUNITY): Payer: Self-pay | Admitting: Specialist

## 2012-07-04 ENCOUNTER — Encounter (HOSPITAL_COMMUNITY)
Admission: RE | Admit: 2012-07-04 | Discharge: 2012-07-04 | Disposition: A | Discharge status: Home / Self Care | Payer: Medicare Other | Source: Ambulatory Visit | Attending: Specialist | Admitting: Specialist

## 2012-07-04 ENCOUNTER — Encounter (HOSPITAL_COMMUNITY): Payer: Self-pay

## 2012-07-04 ENCOUNTER — Encounter (HOSPITAL_COMMUNITY)
Admission: RE | Admit: 2012-07-04 | Discharge: 2012-07-04 | Disposition: A | Discharge status: Home / Self Care | Payer: Medicare Other | Source: Ambulatory Visit | Attending: Anesthesiology | Admitting: Anesthesiology

## 2012-07-04 LAB — URINALYSIS, ROUTINE W REFLEX MICROSCOPIC
Bilirubin Urine: NEGATIVE
Hgb urine dipstick: NEGATIVE
Nitrite: NEGATIVE
Specific Gravity, Urine: 1.011 (ref 1.005–1.030)
pH: 7.5 (ref 5.0–8.0)

## 2012-07-04 LAB — BASIC METABOLIC PANEL
BUN: 23 mg/dL (ref 6–23)
CO2: 24 mEq/L (ref 19–32)
Calcium: 10.3 mg/dL (ref 8.4–10.5)
Creatinine, Ser: 1.49 mg/dL — ABNORMAL HIGH (ref 0.50–1.10)

## 2012-07-04 LAB — CBC
MCHC: 34.3 g/dL (ref 30.0–36.0)
MCV: 93 fL (ref 78.0–100.0)
Platelets: 158 10*3/uL (ref 150–400)
RDW: 12.5 % (ref 11.5–15.5)
WBC: 7.9 10*3/uL (ref 4.0–10.5)

## 2012-07-04 LAB — SURGICAL PCR SCREEN: Staphylococcus aureus: NEGATIVE

## 2012-07-04 LAB — TYPE AND SCREEN: ABO/RH(D): B POS

## 2012-07-04 NOTE — Pre-Procedure Instructions (Signed)
22 Brenda Werner  07/04/2012   Your procedure is scheduled on:  Monday, December 23  Report to [facility] on the 3rd floor at 0530 AM.  Call this number if you have problems the morning of surgery: [phone]   Remember:   Do not eat food or drink liquids:After Midnight.Sunday night    Take these medicines the morning of surgery with A SIP OF WATER: Wellbutrin,Diltiazem,Gabapentin,Omeprazole   Do not wear jewelry, make-up or nail polish.  Do not wear lotions, powders, or perfumes. You may wear deodorant.  Do not shave 48 hours prior to surgery. Men may shave face and neck.  Do not bring valuables to the hospital.  Contacts, dentures or bridgework may not be worn into surgery.  Leave suitcase in the car. After surgery it may be brought to your room.  For patients admitted to the hospital, checkout time is 11:00 AM the day of discharge.         Special Instructions: Shower using CHG 2 nights before surgery and the night before surgery.  If you shower the day of surgery use CHG.  Use special wash - you have one bottle of CHG for all showers.  You should use approximately 1/3 of the bottle for each shower.   Please read over the following fact sheets that you were given: Pain Booklet, Coughing and Deep Breathing, Blood Transfusion Information, MRSA Information and Surgical Site Infection Prevention

## 2012-07-05 NOTE — H&P (Addendum)
TOTAL HIP REVISION ADMISSION H&P  Patient is admitted for left revision total hip arthroplasty.  Subjective:  Chief Complaint: left hip pain  HPI: Brenda Werner, 78 y.o. female, has a history of pain and functional disability in the left hip due to arthritis and patient has failed non-surgical conservative treatments for greater than 12 weeks to include NSAID's and/or analgesics, use of assistive devices and activity modification. The indications for the revision total hip arthroplasty are fracture or mechanical failure of one or more component and elevated chromium and cobalt ions systemically with ASR acetabular component of left total hip prosthesis..  Onset of symptoms was gradual starting 2 years ago with gradually worsening course since that time.  Prior procedures on the left hip include total hip replacement with Depuy components including an ASR acetabular component with metal on metal ball and socket..  Patient currently rates pain in the left hip at 8 out of 10 with activity.  There is night pain, worsening of pain with activity and weight bearing and pain that interfers with activities of daily living.   This condition presents safety issues increasing the risk of falls.  This patient has had elevated chromium and cobalt levels with increaing pain in the hip..  There is no current active infection.  Patient Active Problem List   Diagnosis Date Noted  . Pain due to total hip replacement 07/08/2012    Priority: High    Class: Chronic  . Encounter for IUD removal 11/02/2011  . MIXED HYPERLIPIDEMIA 12/30/2009  . ESSENTIAL HYPERTENSION, BENIGN 12/30/2009  . ELECTROCARDIOGRAM, ABNORMAL 12/30/2009   Past Medical History  Diagnosis Date  . Anxiety   . Arthritis   . Cataract     had surgery  . Depression   . GERD (gastroesophageal reflux disease)   . Heart murmur   . Irregular heart beat     doesn't know type  . Hyperlipidemia   . Hypertension     Past Surgical History   Procedure Date  . Shoulder arthroscopy     twice on right  . Hip surgery     replacement, left  . Replacement total knee     right  . Foot fracture surgery     at ankle, left  . Joint replacement   . Fracture surgery   . Eye surgery   . Cataract extraction w/ intraocular lens  implant, bilateral 2013    Prescriptions prior to admission  Medication Sig Dispense Refill  . acetaminophen (TYLENOL) 500 MG tablet Take 500 mg by mouth every 6 (six) hours as needed. For pain      . aspirin EC 81 MG tablet Take 81 mg by mouth daily.      Marland Kitchen atorvastatin (LIPITOR) 20 MG tablet Take 20 mg by mouth every evening.       Marland Kitchen buPROPion (WELLBUTRIN SR) 150 MG 12 hr tablet Take 150 mg by mouth every morning.      . diltiazem (CARDIZEM CD) 300 MG 24 hr capsule Take 300 mg by mouth daily.      Marland Kitchen gabapentin (NEURONTIN) 300 MG capsule Take 300 mg by mouth 3 (three) times daily.      . Glucosamine-Chondroitin (GLUCOSAMINE CHONDR COMPLEX PO) Take 1 tablet by mouth 2 (two) times daily.      Marland Kitchen HYDROcodone-acetaminophen (NORCO/VICODIN) 5-325 MG per tablet Take 1 tablet by mouth every 4 (four) hours as needed.      . Olopatadine HCl 0.2 % SOLN Place 1 drop into both  eyes daily.       Marland Kitchen omeprazole (PRILOSEC) 20 MG capsule Take 20 mg by mouth 2 (two) times daily.      . potassium chloride (MICRO-K) 10 MEQ CR capsule Take 10 mEq by mouth 2 (two) times daily.      Marland Kitchen triamterene-hydrochlorothiazide (MAXZIDE) 75-50 MG per tablet Take 1 tablet by mouth every morning.        Allergies  Allergen Reactions  . Penicillins Other (See Comments)    unknown  . Latex Rash    History  Substance Use Topics  . Smoking status: Never Smoker   . Smokeless tobacco: Never Used  . Alcohol Use: No    Family History  Problem Relation Age of Onset  . Heart disease Father   . Esophageal cancer Sister       Review of Systems  Constitutional: Negative.   HENT: Negative.   Eyes: Negative.   Respiratory: Negative.    Cardiovascular: Negative.   Gastrointestinal: Negative.   Genitourinary: Negative.   Musculoskeletal: Positive for joint pain.       Left hip pain with pain referred to the anterior thigh and groin  Skin: Negative.   Neurological: Negative.   Endo/Heme/Allergies: Negative.   Psychiatric/Behavioral: Negative.     Objective:  Physical Exam  Constitutional: She is oriented to person, place, and time. She appears well-developed and well-nourished.  HENT:  Head: Normocephalic and atraumatic.  Eyes: EOM are normal. Pupils are equal, round, and reactive to light.  Neck: Normal range of motion.  Cardiovascular: Normal rate, regular rhythm and intact distal pulses.   Respiratory: Effort normal and breath sounds normal.  GI: Soft. Bowel sounds are normal.  Musculoskeletal:       Painful ROM of left hip.  Pain with weight bearing to the left with antalgic gait.  Well healed surgical scar left. No edema of legs   Neurological: She is alert and oriented to person, place, and time.  Skin: Skin is warm and dry.  Psychiatric: She has a normal mood and affect.    Vital signs in last 24 hours: Temp:  [98 F (36.7 C)] 98 F (36.7 C) (12/23 0621) Pulse Rate:  [77] 77  (12/23 0621) Resp:  [16] 16  (12/23 0621) BP: (137)/(84) 137/84 mmHg (12/23 0621) SpO2:  [95 %] 95 % (12/23 0621)   Labs:   Estimated Body mass index is 27.99 kg/(m^2) as calculated from the following:   Height as of 11/16/11: 5\' 3" (1.6 m).   Weight as of 11/16/11: 158 lb(71.668 kg).  Imaging Review:  Plain radiographs demonstrate previous total hip replacement of the left hip(s).The bone quality appears to be adequate for age and reported activity level.  Assessment/Plan:  End stage arthritis, left hip(s) with failed previous arthroplasty.  The patient history, physical examination, clinical judgement of the provider and imaging studies are consistent with end stage degenerative joint disease of the left hip(s),  previous total hip arthroplasty. Revision total hip arthroplasty is deemed medically necessary. The treatment options including medical management, injection therapy, arthroscopy and arthroplasty were discussed at length. The risks and benefits of total hip arthroplasty were presented and reviewed. The risks due to aseptic loosening, infection, stiffness, dislocation/subluxation,  thromboembolic complications and other imponderables were discussed.  The patient acknowledged the explanation, agreed to proceed with the plan and consent was signed. Patient is being admitted for inpatient treatment for surgery, pain control, PT, OT, prophylactic antibiotics, VTE prophylaxis, progressive ambulation and ADL's and discharge planning. The  patient is planning to be discharged to home with family and HHPT/BA/OT and DME. Patient was seen and examined in the preop holding area. There has been no interval  Change in this patient's exam preop  history and physical exam  Lab tests and images have been examined and reviewed.  The Risks benefits and alternative treatments have been discussed  extensively,questions answered.  The patient has elected to undergo the discussed surgical treatment.

## 2012-07-07 MED ORDER — VANCOMYCIN HCL IN DEXTROSE 1-5 GM/200ML-% IV SOLN
1000.0000 mg | INTRAVENOUS | Status: AC
  Administered 2012-07-08: 1000 mg via INTRAVENOUS
  Filled 2012-07-07: qty 200

## 2012-07-08 ENCOUNTER — Encounter (HOSPITAL_COMMUNITY): Payer: Self-pay | Admitting: Anesthesiology

## 2012-07-08 ENCOUNTER — Inpatient Hospital Stay (HOSPITAL_COMMUNITY): Payer: Medicare Other

## 2012-07-08 ENCOUNTER — Encounter (HOSPITAL_COMMUNITY): Payer: Self-pay | Admitting: Specialist

## 2012-07-08 ENCOUNTER — Encounter (HOSPITAL_COMMUNITY): Payer: Self-pay | Admitting: *Deleted

## 2012-07-08 ENCOUNTER — Inpatient Hospital Stay (HOSPITAL_COMMUNITY): Payer: Medicare Other | Admitting: Anesthesiology

## 2012-07-08 ENCOUNTER — Encounter (HOSPITAL_COMMUNITY)
Admission: RE | Disposition: A | Discharge status: Home Health | Payer: Self-pay | Source: Ambulatory Visit | Attending: Specialist

## 2012-07-08 ENCOUNTER — Inpatient Hospital Stay (HOSPITAL_COMMUNITY)
Admission: RE | Admit: 2012-07-08 | Discharge: 2012-07-10 | DRG: 468 | Disposition: A | Discharge status: Home Health | Payer: Medicare Other | Source: Ambulatory Visit | Attending: Specialist | Admitting: Specialist

## 2012-07-08 DIAGNOSIS — E782 Mixed hyperlipidemia: Secondary | ICD-10-CM | POA: Diagnosis present

## 2012-07-08 DIAGNOSIS — F3289 Other specified depressive episodes: Secondary | ICD-10-CM | POA: Diagnosis present

## 2012-07-08 DIAGNOSIS — I10 Essential (primary) hypertension: Secondary | ICD-10-CM | POA: Diagnosis present

## 2012-07-08 DIAGNOSIS — F329 Major depressive disorder, single episode, unspecified: Secondary | ICD-10-CM | POA: Diagnosis present

## 2012-07-08 DIAGNOSIS — Z01812 Encounter for preprocedural laboratory examination: Secondary | ICD-10-CM

## 2012-07-08 DIAGNOSIS — F411 Generalized anxiety disorder: Secondary | ICD-10-CM | POA: Diagnosis present

## 2012-07-08 DIAGNOSIS — K219 Gastro-esophageal reflux disease without esophagitis: Secondary | ICD-10-CM | POA: Diagnosis present

## 2012-07-08 DIAGNOSIS — Z79899 Other long term (current) drug therapy: Secondary | ICD-10-CM

## 2012-07-08 DIAGNOSIS — Z7982 Long term (current) use of aspirin: Secondary | ICD-10-CM

## 2012-07-08 DIAGNOSIS — M25559 Pain in unspecified hip: Secondary | ICD-10-CM | POA: Diagnosis present

## 2012-07-08 DIAGNOSIS — R1011 Right upper quadrant pain: Secondary | ICD-10-CM

## 2012-07-08 DIAGNOSIS — N289 Disorder of kidney and ureter, unspecified: Secondary | ICD-10-CM | POA: Diagnosis present

## 2012-07-08 DIAGNOSIS — T8489XA Other specified complication of internal orthopedic prosthetic devices, implants and grafts, initial encounter: Principal | ICD-10-CM | POA: Diagnosis present

## 2012-07-08 DIAGNOSIS — T8484XA Pain due to internal orthopedic prosthetic devices, implants and grafts, initial encounter: Secondary | ICD-10-CM

## 2012-07-08 DIAGNOSIS — Z01818 Encounter for other preprocedural examination: Secondary | ICD-10-CM

## 2012-07-08 DIAGNOSIS — Y831 Surgical operation with implant of artificial internal device as the cause of abnormal reaction of the patient, or of later complication, without mention of misadventure at the time of the procedure: Secondary | ICD-10-CM | POA: Diagnosis present

## 2012-07-08 DIAGNOSIS — Z96649 Presence of unspecified artificial hip joint: Secondary | ICD-10-CM

## 2012-07-08 HISTORY — PX: TOTAL HIP REVISION: SHX763

## 2012-07-08 LAB — GRAM STAIN

## 2012-07-08 SURGERY — TOTAL HIP REVISION
Anesthesia: General | Site: Hip | Laterality: Left | Wound class: Clean

## 2012-07-08 MED ORDER — MIDAZOLAM HCL 2 MG/2ML IJ SOLN
1.0000 mg | INTRAMUSCULAR | Status: DC | PRN
Start: 2012-07-08 — End: 2012-07-08

## 2012-07-08 MED ORDER — NEOSTIGMINE METHYLSULFATE 1 MG/ML IJ SOLN
INTRAMUSCULAR | Status: DC | PRN
  Administered 2012-07-08: 3 mg via INTRAVENOUS

## 2012-07-08 MED ORDER — CHLORHEXIDINE GLUCONATE 4 % EX LIQD: 60.0000 mL | Freq: Once | CUTANEOUS | Status: DC

## 2012-07-08 MED ORDER — POTASSIUM CHLORIDE CRYS ER 10 MEQ PO TBCR
10.0000 meq | EXTENDED_RELEASE_TABLET | Freq: Every day | ORAL | Status: DC
  Administered 2012-07-08 – 2012-07-10 (×3): 10 meq via ORAL
  Filled 2012-07-08 (×3): qty 1

## 2012-07-08 MED ORDER — SODIUM CHLORIDE 0.9 % IV SOLN: 500.0000 mL | INTRAVENOUS | Status: DC

## 2012-07-08 MED ORDER — SODIUM CHLORIDE 0.9 % IR SOLN
Status: DC | PRN
  Administered 2012-07-08: 1000 mL

## 2012-07-08 MED ORDER — ACETAMINOPHEN 10 MG/ML IV SOLN
1000.0000 mg | Freq: Once | INTRAVENOUS | Status: AC
  Administered 2012-07-08: 1000 mg via INTRAVENOUS

## 2012-07-08 MED ORDER — OXYCODONE HCL 5 MG PO TABS: 5.0000 mg | ORAL_TABLET | Freq: Once | ORAL | Status: DC | PRN

## 2012-07-08 MED ORDER — ONDANSETRON HCL 4 MG/2ML IJ SOLN
INTRAMUSCULAR | Status: DC | PRN
  Administered 2012-07-08: 4 mg via INTRAVENOUS

## 2012-07-08 MED ORDER — ACETAMINOPHEN 10 MG/ML IV SOLN
INTRAVENOUS | Status: AC
  Filled 2012-07-08: qty 100

## 2012-07-08 MED ORDER — ARTIFICIAL TEARS OP OINT
TOPICAL_OINTMENT | OPHTHALMIC | Status: DC | PRN
  Administered 2012-07-08: 1 via OPHTHALMIC

## 2012-07-08 MED ORDER — PROMETHAZINE HCL 25 MG/ML IJ SOLN
INTRAMUSCULAR | Status: AC
  Filled 2012-07-08: qty 1

## 2012-07-08 MED ORDER — PROPOFOL 10 MG/ML IV BOLUS
INTRAVENOUS | Status: DC | PRN
  Administered 2012-07-08: 100 mg via INTRAVENOUS

## 2012-07-08 MED ORDER — ALUM & MAG HYDROXIDE-SIMETH 200-200-20 MG/5ML PO SUSP: 30.0000 mL | ORAL | Status: DC | PRN

## 2012-07-08 MED ORDER — BUPIVACAINE HCL (PF) 0.5 % IJ SOLN
INTRAMUSCULAR | Status: AC
  Filled 2012-07-08: qty 30

## 2012-07-08 MED ORDER — KETOROLAC TROMETHAMINE 15 MG/ML IJ SOLN
15.0000 mg | Freq: Four times a day (QID) | INTRAMUSCULAR | Status: DC
  Filled 2012-07-08 (×4): qty 1

## 2012-07-08 MED ORDER — RIVAROXABAN 10 MG PO TABS
10.0000 mg | ORAL_TABLET | Freq: Every day | ORAL | Status: DC
  Administered 2012-07-09: 10 mg via ORAL
  Filled 2012-07-08 (×3): qty 1

## 2012-07-08 MED ORDER — DILTIAZEM HCL ER COATED BEADS 300 MG PO CP24
300.0000 mg | ORAL_CAPSULE | Freq: Every day | ORAL | Status: DC
  Administered 2012-07-08 – 2012-07-10 (×3): 300 mg via ORAL
  Filled 2012-07-08 (×3): qty 1

## 2012-07-08 MED ORDER — OLOPATADINE HCL 0.1 % OP SOLN
1.0000 [drp] | Freq: Two times a day (BID) | OPHTHALMIC | Status: DC
  Administered 2012-07-08 – 2012-07-09 (×4): 1 [drp] via OPHTHALMIC
  Filled 2012-07-08 (×3): qty 5

## 2012-07-08 MED ORDER — ACETAMINOPHEN 650 MG RE SUPP: 650.0000 mg | Freq: Four times a day (QID) | RECTAL | Status: DC | PRN

## 2012-07-08 MED ORDER — BUPIVACAINE-EPINEPHRINE PF 0.25-1:200000 % IJ SOLN
INTRAMUSCULAR | Status: AC
  Filled 2012-07-08: qty 30

## 2012-07-08 MED ORDER — ALBUMIN HUMAN 5 % IV SOLN
INTRAVENOUS | Status: DC | PRN
  Administered 2012-07-08: 09:00:00 via INTRAVENOUS

## 2012-07-08 MED ORDER — ACETAMINOPHEN 325 MG PO TABS
650.0000 mg | ORAL_TABLET | Freq: Four times a day (QID) | ORAL | Status: DC | PRN
  Administered 2012-07-09: 650 mg via ORAL
  Filled 2012-07-08: qty 2

## 2012-07-08 MED ORDER — DIPHENHYDRAMINE HCL 12.5 MG/5ML PO ELIX: 12.5000 mg | ORAL_SOLUTION | ORAL | Status: DC | PRN

## 2012-07-08 MED ORDER — FENTANYL CITRATE 0.05 MG/ML IJ SOLN: 50.0000 ug | Freq: Once | INTRAMUSCULAR | Status: DC

## 2012-07-08 MED ORDER — DOCUSATE SODIUM 100 MG PO CAPS
100.0000 mg | ORAL_CAPSULE | Freq: Two times a day (BID) | ORAL | Status: DC
  Administered 2012-07-08 – 2012-07-10 (×4): 100 mg via ORAL
  Filled 2012-07-08 (×6): qty 1

## 2012-07-08 MED ORDER — LIDOCAINE HCL (CARDIAC) 20 MG/ML IV SOLN
INTRAVENOUS | Status: DC | PRN
  Administered 2012-07-08: 60 mg via INTRAVENOUS

## 2012-07-08 MED ORDER — HYDROMORPHONE HCL PF 1 MG/ML IJ SOLN
0.2500 mg | INTRAMUSCULAR | Status: DC | PRN
  Administered 2012-07-08 (×2): 0.5 mg via INTRAVENOUS

## 2012-07-08 MED ORDER — POLYETHYLENE GLYCOL 3350 17 G PO PACK: 17.0000 g | PACK | Freq: Every day | ORAL | Status: DC | PRN

## 2012-07-08 MED ORDER — GABAPENTIN 300 MG PO CAPS
300.0000 mg | ORAL_CAPSULE | Freq: Three times a day (TID) | ORAL | Status: DC
  Administered 2012-07-08 – 2012-07-10 (×6): 300 mg via ORAL
  Filled 2012-07-08 (×9): qty 1

## 2012-07-08 MED ORDER — HYDROMORPHONE HCL PF 1 MG/ML IJ SOLN
INTRAMUSCULAR | Status: AC
  Filled 2012-07-08: qty 1

## 2012-07-08 MED ORDER — HYDROCODONE-ACETAMINOPHEN 7.5-325 MG PO TABS
1.0000 | ORAL_TABLET | Freq: Four times a day (QID) | ORAL | Status: DC
  Administered 2012-07-08 – 2012-07-10 (×3): 1 via ORAL
  Filled 2012-07-08 (×3): qty 1

## 2012-07-08 MED ORDER — FENTANYL CITRATE 0.05 MG/ML IJ SOLN
INTRAMUSCULAR | Status: DC | PRN
  Administered 2012-07-08: 100 ug via INTRAVENOUS
  Administered 2012-07-08 (×2): 50 ug via INTRAVENOUS

## 2012-07-08 MED ORDER — GLYCOPYRROLATE 0.2 MG/ML IJ SOLN
INTRAMUSCULAR | Status: DC | PRN
  Administered 2012-07-08: 0.4 mg via INTRAVENOUS

## 2012-07-08 MED ORDER — MENTHOL 3 MG MT LOZG: 1.0000 | LOZENGE | OROMUCOSAL | Status: DC | PRN

## 2012-07-08 MED ORDER — ONDANSETRON HCL 4 MG/2ML IJ SOLN
4.0000 mg | Freq: Four times a day (QID) | INTRAMUSCULAR | Status: DC | PRN
  Administered 2012-07-09: 4 mg via INTRAVENOUS
  Filled 2012-07-08: qty 2

## 2012-07-08 MED ORDER — ONDANSETRON HCL 4 MG PO TABS: 4.0000 mg | ORAL_TABLET | Freq: Four times a day (QID) | ORAL | Status: DC | PRN

## 2012-07-08 MED ORDER — BISACODYL 10 MG RE SUPP: 10.0000 mg | Freq: Every day | RECTAL | Status: DC | PRN

## 2012-07-08 MED ORDER — PHENOL 1.4 % MT LIQD: 1.0000 | OROMUCOSAL | Status: DC | PRN

## 2012-07-08 MED ORDER — LACTATED RINGERS IV SOLN
INTRAVENOUS | Status: DC | PRN
  Administered 2012-07-08 (×2): via INTRAVENOUS

## 2012-07-08 MED ORDER — PROMETHAZINE HCL 25 MG/ML IJ SOLN: 6.2500 mg | INTRAMUSCULAR | Status: DC | PRN

## 2012-07-08 MED ORDER — FLEET ENEMA 7-19 GM/118ML RE ENEM: 1.0000 | ENEMA | Freq: Once | RECTAL | Status: AC | PRN

## 2012-07-08 MED ORDER — TRIAMTERENE-HCTZ 75-50 MG PO TABS
1.0000 | ORAL_TABLET | Freq: Every morning | ORAL | Status: DC
  Administered 2012-07-08 – 2012-07-10 (×3): 1 via ORAL
  Filled 2012-07-08 (×3): qty 1

## 2012-07-08 MED ORDER — BUPROPION HCL ER (SR) 150 MG PO TB12
150.0000 mg | ORAL_TABLET | Freq: Every morning | ORAL | Status: DC
  Administered 2012-07-08 – 2012-07-10 (×3): 150 mg via ORAL
  Filled 2012-07-08 (×3): qty 1

## 2012-07-08 MED ORDER — PANTOPRAZOLE SODIUM 40 MG PO TBEC
40.0000 mg | DELAYED_RELEASE_TABLET | Freq: Every day | ORAL | Status: DC
  Administered 2012-07-08 – 2012-07-10 (×3): 40 mg via ORAL
  Filled 2012-07-08 (×3): qty 1

## 2012-07-08 MED ORDER — MORPHINE SULFATE 2 MG/ML IJ SOLN: 1.0000 mg | INTRAMUSCULAR | Status: DC | PRN

## 2012-07-08 MED ORDER — ROCURONIUM BROMIDE 100 MG/10ML IV SOLN
INTRAVENOUS | Status: DC | PRN
  Administered 2012-07-08: 50 mg via INTRAVENOUS

## 2012-07-08 MED ORDER — ZOLPIDEM TARTRATE 5 MG PO TABS
5.0000 mg | ORAL_TABLET | Freq: Every day | ORAL | Status: DC
  Filled 2012-07-08: qty 1

## 2012-07-08 MED ORDER — METOCLOPRAMIDE HCL 5 MG/ML IJ SOLN: 5.0000 mg | Freq: Three times a day (TID) | INTRAMUSCULAR | Status: DC | PRN

## 2012-07-08 MED ORDER — FERROUS SULFATE 325 (65 FE) MG PO TABS
325.0000 mg | ORAL_TABLET | Freq: Two times a day (BID) | ORAL | Status: DC
  Administered 2012-07-09 – 2012-07-10 (×3): 325 mg via ORAL
  Filled 2012-07-08 (×6): qty 1

## 2012-07-08 MED ORDER — ATORVASTATIN CALCIUM 20 MG PO TABS
20.0000 mg | ORAL_TABLET | Freq: Every evening | ORAL | Status: DC
  Administered 2012-07-08 – 2012-07-09 (×2): 20 mg via ORAL
  Filled 2012-07-08 (×4): qty 1

## 2012-07-08 MED ORDER — METOCLOPRAMIDE HCL 10 MG PO TABS: 5.0000 mg | ORAL_TABLET | Freq: Three times a day (TID) | ORAL | Status: DC | PRN

## 2012-07-08 MED ORDER — KCL IN DEXTROSE-NACL 10-5-0.45 MEQ/L-%-% IV SOLN
INTRAVENOUS | Status: DC
  Administered 2012-07-08: 14:00:00 via INTRAVENOUS
  Administered 2012-07-08: 100 mL/h via INTRAVENOUS
  Filled 2012-07-08 (×6): qty 1000

## 2012-07-08 MED ORDER — OXYCODONE HCL 5 MG/5ML PO SOLN: 5.0000 mg | Freq: Once | ORAL | Status: DC | PRN

## 2012-07-08 MED ORDER — HYDROCODONE-ACETAMINOPHEN 7.5-325 MG PO TABS: 1.0000 | ORAL_TABLET | ORAL | Status: DC | PRN

## 2012-07-08 SURGICAL SUPPLY — 85 items
ACETAB CUP W/GRIPTION 54 (Plate) ×2 IMPLANT
ADH SKN CLS APL DERMABOND .7 (GAUZE/BANDAGES/DRESSINGS) ×1
APL SKNCLS STERI-STRIP NONHPOA (GAUZE/BANDAGES/DRESSINGS)
BENZOIN TINCTURE PRP APPL 2/3 (GAUZE/BANDAGES/DRESSINGS) ×1 IMPLANT
BRUSH FEMORAL CANAL (MISCELLANEOUS) IMPLANT
CLOTH BEACON ORANGE TIMEOUT ST (SAFETY) ×2 IMPLANT
COVER BACK TABLE 24X17X13 BIG (DRAPES) IMPLANT
COVER SURGICAL LIGHT HANDLE (MISCELLANEOUS) ×2 IMPLANT
CUP ACETAB W/GRIPTION 54 (Plate) IMPLANT
DERMABOND ADVANCED (GAUZE/BANDAGES/DRESSINGS) ×1
DERMABOND ADVANCED .7 DNX12 (GAUZE/BANDAGES/DRESSINGS) IMPLANT
DRAPE INCISE IOBAN 66X45 STRL (DRAPES) ×2 IMPLANT
DRAPE ORTHO SPLIT 77X108 STRL (DRAPES) ×4
DRAPE SURG ORHT 6 SPLT 77X108 (DRAPES) ×2 IMPLANT
DRAPE U-SHAPE 47X51 STRL (DRAPES) ×2 IMPLANT
DRILL BIT 7/64X5 (BIT) ×2 IMPLANT
DRSG MEPILEX BORDER 4X12 (GAUZE/BANDAGES/DRESSINGS) ×1 IMPLANT
DRSG MEPILEX BORDER 4X8 (GAUZE/BANDAGES/DRESSINGS) ×1 IMPLANT
DURAPREP 26ML APPLICATOR (WOUND CARE) ×3 IMPLANT
ELECT BLADE 6.5 EXT (BLADE) IMPLANT
ELECT REM PT RETURN 9FT ADLT (ELECTROSURGICAL) ×2
ELECTRODE REM PT RTRN 9FT ADLT (ELECTROSURGICAL) ×1 IMPLANT
ELIMINATOR HOLE APEX DEPUY (Hips) ×1 IMPLANT
EVACUATOR 1/8 PVC DRAIN (DRAIN) ×2 IMPLANT
FACESHIELD LNG OPTICON STERILE (SAFETY) ×5 IMPLANT
FILTER STRAW FLUID ASPIR (MISCELLANEOUS) ×1 IMPLANT
GLOVE BIOGEL PI IND STRL 7.0 (GLOVE) IMPLANT
GLOVE BIOGEL PI IND STRL 7.5 (GLOVE) ×1 IMPLANT
GLOVE BIOGEL PI IND STRL 8.5 (GLOVE) IMPLANT
GLOVE BIOGEL PI INDICATOR 7.0 (GLOVE) ×1
GLOVE BIOGEL PI INDICATOR 7.5 (GLOVE) ×1
GLOVE BIOGEL PI INDICATOR 8.5 (GLOVE) ×2
GLOVE ECLIPSE 7.0 STRL STRAW (GLOVE) ×1 IMPLANT
GLOVE ECLIPSE 8.5 STRL (GLOVE) ×1 IMPLANT
GLOVE SS N UNI LF 7.0 STRL (GLOVE) ×1 IMPLANT
GLOVE SS PI 9.0 STRL (GLOVE) ×1 IMPLANT
GLOVE SURG 8.5 LATEX PF (GLOVE) ×1 IMPLANT
GLOVE SURG SS PI 6.5 STRL IVOR (GLOVE) ×1 IMPLANT
GLOVE SURG SS PI 7.5 STRL IVOR (GLOVE) ×1 IMPLANT
GLOVE SURG SS PI 8.5 STRL IVOR (GLOVE) ×3
GLOVE SURG SS PI 8.5 STRL STRW (GLOVE) IMPLANT
GOWN BRE IMP SLV AUR XL STRL (GOWN DISPOSABLE) ×1 IMPLANT
GOWN PREVENTION PLUS LG XLONG (DISPOSABLE) ×1 IMPLANT
GOWN PREVENTION PLUS XXLARGE (GOWN DISPOSABLE) ×4 IMPLANT
GOWN STRL NON-REIN LRG LVL3 (GOWN DISPOSABLE) ×1 IMPLANT
GOWN STRL REIN 2XL XLG LVL4 (GOWN DISPOSABLE) ×2 IMPLANT
HANDPIECE INTERPULSE COAX TIP (DISPOSABLE)
HEAD FEM DLT TS CER 36X+5.0 (Head) ×1 IMPLANT
IMMOBILIZER KNEE 20 (SOFTGOODS) ×2
IMMOBILIZER KNEE 20 THIGH 36 (SOFTGOODS) ×1 IMPLANT
KIT BASIN OR (CUSTOM PROCEDURE TRAY) ×2 IMPLANT
KIT ROOM TURNOVER OR (KITS) ×2 IMPLANT
LINER MARATHON 10D 36X54 (Hips) IMPLANT
LINER MARATHON 10DEG 36X54 (Hips) ×2 IMPLANT
MANIFOLD NEPTUNE II (INSTRUMENTS) ×2 IMPLANT
NDL SPNL 18GX3.5 QUINCKE PK (NEEDLE) IMPLANT
NEEDLE 22X1 1/2 (OR ONLY) (NEEDLE) ×2 IMPLANT
NEEDLE SPNL 18GX3.5 QUINCKE PK (NEEDLE) ×2 IMPLANT
NS IRRIG 1000ML POUR BTL (IV SOLUTION) ×2 IMPLANT
PACK TOTAL JOINT (CUSTOM PROCEDURE TRAY) ×2 IMPLANT
PAD ARMBOARD 7.5X6 YLW CONV (MISCELLANEOUS) ×4 IMPLANT
PASSER SUT SWANSON 36MM LOOP (INSTRUMENTS) ×2 IMPLANT
PRESSURIZER FEMORAL UNIV (MISCELLANEOUS) IMPLANT
SCREW 6.5MMX25MM (Screw) ×2 IMPLANT
SET HNDPC FAN SPRY TIP SCT (DISPOSABLE) IMPLANT
SPECIMEN JAR SMALL (MISCELLANEOUS) ×2 IMPLANT
SPONGE LAP 4X18 X RAY DECT (DISPOSABLE) ×1 IMPLANT
STRIP CLOSURE SKIN 1/2X4 (GAUZE/BANDAGES/DRESSINGS) ×2 IMPLANT
SUCTION FRAZIER TIP 10 FR DISP (SUCTIONS) ×1 IMPLANT
SUT ETHIBOND NAB CT1 #1 30IN (SUTURE) ×9 IMPLANT
SUT VIC AB 0 CT1 27 (SUTURE) ×4
SUT VIC AB 0 CT1 27XBRD ANBCTR (SUTURE) ×2 IMPLANT
SUT VIC AB 1 CT1 27 (SUTURE) ×4
SUT VIC AB 1 CT1 27XBRD ANBCTR (SUTURE) ×2 IMPLANT
SUT VIC AB 2-0 CT1 27 (SUTURE) ×4
SUT VIC AB 2-0 CT1 TAPERPNT 27 (SUTURE) ×2 IMPLANT
SUT VICRYL 4-0 PS2 18IN ABS (SUTURE) ×2 IMPLANT
SYR CONTROL 10ML LL (SYRINGE) ×2 IMPLANT
TOWEL OR 17X24 6PK STRL BLUE (TOWEL DISPOSABLE) ×2 IMPLANT
TOWEL OR 17X26 10 PK STRL BLUE (TOWEL DISPOSABLE) ×2 IMPLANT
TOWER CARTRIDGE SMART MIX (DISPOSABLE) IMPLANT
TRAY FOLEY BAG SILVER LF 14FR (CATHETERS) ×1 IMPLANT
TRAY FOLEY CATH 14FR (SET/KITS/TRAYS/PACK) ×1 IMPLANT
TUBE ANAEROBIC SPECIMEN COL (MISCELLANEOUS) IMPLANT
WATER STERILE IRR 1000ML POUR (IV SOLUTION) ×5 IMPLANT

## 2012-07-08 NOTE — Anesthesia Postprocedure Evaluation (Signed)
  Anesthesia Post-op Note  Patient: Brenda Werner  Procedure(s) Performed: Procedure(s) (LRB) with comments: TOTAL HIP REVISION (Left) - Revision of left hip ASR to Gryption acetabulum with poly and ceramic femoral head, removal of ASR head and acetabular shell  Patient Location: PACU  Anesthesia Type:General  Level of Consciousness: awake and alert   Airway and Oxygen Therapy: Patient Spontanous Breathing  Post-op Pain: mild  Post-op Assessment: Post-op Vital signs reviewed, Patient's Cardiovascular Status Stable, Respiratory Function Stable, Patent Airway, No signs of Nausea or vomiting and Pain level controlled  Post-op Vital Signs: stable  Complications: No apparent anesthesia complications

## 2012-07-08 NOTE — Progress Notes (Signed)
UR COMPLETED  

## 2012-07-08 NOTE — H&P (Signed)
Patient examined H&P reviewed with Dondra Spry.

## 2012-07-08 NOTE — Progress Notes (Signed)
Notified Dr. Gypsy Balsam of pts frequent clearing of throat and upper airway inspiratory wheeze.  Pt answers questions, answers no when asked if having trouble catching breath.  Dr. Gypsy Balsam stated he will send someone to the bedside to assess pt.

## 2012-07-08 NOTE — Preoperative (Signed)
Beta Blockers   Reason not to administer Beta Blockers:Not Applicable 

## 2012-07-08 NOTE — Progress Notes (Signed)
Dr. Michelle Piper at bedside to assess patient.  No new orders.

## 2012-07-08 NOTE — Evaluation (Signed)
Physical Therapy Evaluation Patient Details Name: Brenda Werner MRN: [id number] DOB: 30-Oct-1929 Today's Date: 07/08/2012 Time: 7829-5621 PT Time Calculation (min): 17 min  PT Assessment / Plan / Recommendation Clinical Impression  Patient is an 78 yo female s/p Lt. THA (revision).  Patient with decreased strength and pain impacting mobility.  Patient will benefit from acute PT to maximize independence prior to return home.  Recommend HHPT at discharge for continued therapy.    PT Assessment  Patient needs continued PT services    Follow Up Recommendations  Home health PT;Supervision/Assistance - 24 hour    Does the patient have the potential to tolerate intense rehabilitation      Barriers to Discharge None      Equipment Recommendations  None recommended by PT    Recommendations for Other Services     Frequency 7X/week    Precautions / Restrictions Precautions Precautions: Posterior Hip Precaution Booklet Issued: Yes (comment) Precaution Comments: Reviewed precautions with patient. Restrictions Weight Bearing Restrictions: Yes LLE Weight Bearing: Weight bearing as tolerated   Pertinent Vitals/Pain       Mobility  Bed Mobility Bed Mobility: Supine to Sit;Sitting - Scoot to Edge of Bed;Sit to Supine Supine to Sit: 4: Min assist;HOB flat Sitting - Scoot to Edge of Bed: 4: Min guard;With rail Sit to Supine: 4: Min assist;HOB flat Details for Bed Mobility Assistance: Verbal cues for technique.  Assist to move LLE off of and onto bed during transitions. Patient able to sit at EOB x 7 minutes with min guard assist with good balance and upright posture. Transfers Transfers: Not assessed       Exercises Total Joint Exercises Ankle Circles/Pumps: AROM;Both;10 reps;Supine   PT Diagnosis: Difficulty walking;Acute pain  PT Problem List: Decreased strength;Decreased range of motion;Decreased activity tolerance;Decreased mobility;Decreased knowledge of use of  DME;Decreased knowledge of precautions;Pain PT Treatment Interventions: DME instruction;Gait training;Stair training;Functional mobility training;Therapeutic exercise;Patient/family education   PT Goals Acute Rehab PT Goals PT Goal Formulation: With patient Time For Goal Achievement: 07/15/12 Potential to Achieve Goals: Good Pt will go Supine/Side to Sit: with supervision;with HOB 0 degrees PT Goal: Supine/Side to Sit - Progress: Goal set today Pt will go Sit to Supine/Side: with supervision;with HOB 0 degrees PT Goal: Sit to Supine/Side - Progress: Goal set today Pt will go Sit to Stand: with supervision;with upper extremity assist PT Goal: Sit to Stand - Progress: Goal set today Pt will Ambulate: 51 - 150 feet;with supervision;with rolling walker PT Goal: Ambulate - Progress: Goal set today Pt will Go Up / Down Stairs: 3-5 stairs;with min assist;with rail(s);with least restrictive assistive device PT Goal: Up/Down Stairs - Progress: Goal set today Pt will Perform Home Exercise Program: with supervision, verbal cues required/provided PT Goal: Perform Home Exercise Program - Progress: Goal set today  Visit Information  Last PT Received On: 07/08/12 Assistance Needed: +1    Subjective Data  Subjective: "I'm a little sleepy" Patient Stated Goal: To go home   Prior Functioning  Home Living Lives With: Daughter Available Help at Discharge: Family;Available 24 hours/day Type of Home: House Home Access: Stairs to enter [employer] of Steps: 5 Entrance Stairs-Rails: Right;Left Home Layout: One level Bathroom Shower/Tub: Engineer, manufacturing systems: Standard Bathroom Accessibility: Yes How Accessible: Accessible via walker Home Adaptive Equipment: Bedside commode/3-in-1;Shower chair with back;Walker - rolling;Straight cane Prior Function Level of Independence: Independent with assistive device(s);Needs assistance Needs Assistance: Bathing;Meal Prep;Light  Housekeeping Bath: Minimal Meal Prep: Maximal Light Housekeeping: Total Able to Take Stairs?:  Yes Driving: No Vocation: Retired Musician: No difficulties    Cognition  Overall Cognitive Status: Appears within functional limits for tasks assessed/performed Arousal/Alertness: Lethargic Orientation Level: Appears intact for tasks assessed Behavior During Session: Lethargic    Extremity/Trunk Assessment Right Upper Extremity Assessment RUE ROM/Strength/Tone: WFL for tasks assessed RUE Sensation: WFL - Light Touch Left Upper Extremity Assessment LUE ROM/Strength/Tone: WFL for tasks assessed LUE Sensation: WFL - Light Touch Right Lower Extremity Assessment RLE ROM/Strength/Tone: WFL for tasks assessed RLE Sensation: WFL - Light Touch Left Lower Extremity Assessment LLE ROM/Strength/Tone: Deficits;Unable to fully assess;Due to pain LLE ROM/Strength/Tone Deficits: Able to assist moving LLE off of and onto bed   Balance Balance Balance Assessed: Yes Static Sitting Balance Static Sitting - Balance Support: No upper extremity supported;Feet supported Static Sitting - Level of Assistance: 5: Stand by assistance Static Sitting - Comment/# of Minutes: Patient able to sit EOB x 7 minutes with good balance and upright posture.  No UE support needed.  End of Session PT - End of Session Equipment Utilized During Treatment: Oxygen Activity Tolerance: Patient limited by fatigue;Patient limited by pain Patient left: in bed;with call bell/phone within reach Nurse Communication: Mobility status;Patient requests pain meds  GP     Vena Austria 07/08/2012, 4:00 PM  Durenda Hurt. Renaldo Fiddler, [facility] Acute [facility]hab [facility] Pager [phone]

## 2012-07-08 NOTE — Transfer of Care (Signed)
Immediate Anesthesia Transfer of Care Note  Patient: Brenda Werner  Procedure(s) Performed: Procedure(s) (LRB) with comments: TOTAL HIP REVISION (Left) - Revision of left hip ASR to Gryption acetabulum with poly and ceramic femoral head, removal of ASR head and acetabular shell  Patient Location: PACU  Anesthesia Type:General  Level of Consciousness: awake, oriented and patient cooperative  Airway & Oxygen Therapy: Patient Spontanous Breathing and Patient connected to nasal cannula oxygen  Post-op Assessment: Report given to PACU RN and Post -op Vital signs reviewed and stable  Post vital signs: Reviewed  Complications: No apparent anesthesia complications

## 2012-07-08 NOTE — Brief Op Note (Signed)
07/08/2012  9:47 AM  PATIENT:  Brenda Werner  78 y.o. female  PRE-OPERATIVE DIAGNOSIS:  Painful left hip ASR with elevated cobalt ion levels  POST-OPERATIVE DIAGNOSIS:  Painful left hip ASR with elevated cobalt ion levels  PROCEDURE:  Procedure(s) (LRB) with comments: TOTAL HIP REVISION (Left) - Revision of left hip ASR to Gryption acetabulum with poly and ceramic femoral head, removal of ASR head and acetabular shell  SURGEON:  Surgeon(s) and Role: Kerrin Champagne, MD - Primary  PHYSICIAN ASSISTANT: Maud Deed, PA-C  ANESTHESIA:   general Dr. Gypsy Balsam EBL:  Total I/O In: 1250 [I.V.:1000; IV Piggyback:250] Out: 675 [Urine:375; Blood:300]  BLOOD ADMINISTERED:none  DRAINS: (One ) Hemovact drain(s) in the left anterolat prox thigh. with  Suction Open and Urinary Catheter (Foley)   LOCAL MEDICATIONS USED:  NONE  SPECIMEN:  Source of Specimen:  Left hip aspirate sent for C&S and gram stain,  hip capsule material sent for pathology., Aspirate and Excision  DISPOSITION OF SPECIMEN:  aspirate sent to microbiology.  COUNTS:  YES  TOURNIQUET:  * No tourniquets in log *  DICTATION: .Dragon Dictation  PLAN OF CARE: Admit to inpatient   PATIENT DISPOSITION:  PACU - hemodynamically stable.   Delay start of Pharmacological VTE agent (>24hrs) due to surgical blood loss or risk of bleeding: no

## 2012-07-08 NOTE — Progress Notes (Signed)
hemovac clamped at 1015.

## 2012-07-08 NOTE — Op Note (Signed)
07/08/2012  9:52 AM  OPERATIVE REPORT   PATIENT:  Brenda Werner  78 y.o. female  MRN: [id number]  PRE-OPERATIVE DIAGNOSIS:  Painful left hip ASR with elevated cobalt ion levels  POST-OPERATIVE DIAGNOSIS:  Painful left hip ASR with elevated cobalt ion levels  PROCEDURE:  Procedure(s): TOTAL HIP REVISION left ASR to metal on polyethylene Total hip replacement with 54 mm gryption acetabular shell with 2 fixation screws, 36mm +60mm femoral head and neck.     SURGEON: Kerrin Champagne, MD    ASSISTANT: Maud Deed, PA-C  (Present throughout the entire procedure and necessary for completion of procedure in a timely manner)     ANESTHESIA:  General , Dr. Gypsy Balsam.    COMPLICATIONS:  None.   COMPONENTS: DePuy [place] press fit 36 mm outer diameter hip ceramic ball  with a +5 mm neck length, a 54 mm outer diameter Gryption acetabulum with 2 fixation, and a polyethylene liner +4, 10-degree  posterior lip. Components were Press-Fit.   SPECIMENS: Left total hip replacement aspirate for metal ion analysis, Cobalt and Chromium. Left THR synovium for  Pathology diagnosis. Left ASR head and acetabular shell to pathology evaluation. Swab of left hip for Stat gram stain and C&S.   STAT GRAM STAIN: Few polys and few lymphs no organisms seen.  PROCEDURE IN DETAIL:  The patient was met in the holding area and  identified. The appropriate leftt hip was identified and marked at the operative site. Preoperative antibiotics Vancomycin  was given . The patient was then transported to the OR and  placed under general l anesthesia. At that point, the patient was  placed in the lateral decubitus position with the operative left side up and  secured to the operating room table with the Innomed hip system.  The operative lower extremity was prepped from the iliac crest to the distal  leg with Betadine scrub and then DuraPrep. Sterile draping was  performed.  A routine southern incision was utilized and via  sharp dissection  carried down to the subcutaneous tissue.Ellipsing the old incision scar. Gross bleeders were Bovie  coagulated. The iliotibial band was quickly identified and incised  along the length of the skin incision. Self-retaining retractors were  inserted. With the hip internally rotated, the short external rotators  were identified. Old incision sutures removed.Tendinous structures were tagged with 0 Ethibond  Suture. The hip was aspirated 20 cc on yellow cloudy fluid. This was sent for metal ion analysis. The hip capsule was identified and incised along the total hip femoral  neck and head. There was a moderate cloudy yellow joint effusion. Swab sent for C&S and Stat gram stain. The posterior hip capsule and superior hip capsule was resected. Hip was  dislocated posteriorly. Retractors were then placed about the acetabulum. Further capsule section  was performed. The ASR femoral head and neck liner were disimpacted with a bone tamp.The femoral implant neck and trunion were placed anterior to the acetabulum. Retractor was placed about the anterior acetabulum. The acetabular shell was then able to be removed by using a thin osteotome to start a interval between the acetabular shell and the bone acetabulum. A acetabular implant socket cutter was then introduced and used to perform further development of the interval between the acetabular shell and the bone acetabulum. This allowed for loosing of the cup from its bone attachment to the superior, posterior and inferior posterior aspect of the bone acetabulum. The shell was then removed by grasping with a  Kocher  clamp along its posterior margin. Reaming was performed to the 53mm size.  It had very nice bleeding circumferentially. I then trialed the 54 mm acetabular component. It had  complete seating. Accordingly, the trial acetabular component was  impacted into the acetabulum. It was a very nice fit. It was nice and  stable.  The trial 10  polyethylene liner was inserted.We trialed a number of neck lengths and felt like trial +5 mm neck and 36 mm ball was the most stable. At that point, there was minimal toggling and complete stability. Leg lengths were appropriate.  The trial components were then removed. The joint was copiously  irrigated with saline solution. A permanent Gryption porous-coated 54 mm acetabular implant was then impacted into place observed to be fully seated in approximately 30 of abduction and 20 anteversion.The insertion guide was used.Two fixation screws were place each provided excellent bone purchase. Apex hole eliminator was inserted into  the acetabular component followed by the final Marathon 10 polyethylene liner. Trial reducion showed excellent stability with 100 degree flexion and internal rotation in 90 degree flexion stable with nearly 70 degrees internal rotation. Full extension and external rotation showed no instability. Shuck test negative for laxity of the total hip replacement.The hip was then dislocated and the permanent 36mm femoral head with +5 mm neck was place after first cleasing the trunion on the femoral implant. This was impacted onto the femoral implant trunion fitting. The total hip replacement was reduced, and placed through a full range of motion, it was perfectly stable and there was no subluxation. There was no evidence of  instability. It had a very nice construct.  Wound was then irrigated with saline solution. The capsule was closed  anatomically with #1 Ethibond. The short external rotators were closed  with similar material through drill hole to the greater trochanter using a suture passer to pass the suture. The wound was again irrigated with saline  solution. The iliotibial band was closed with interrupted #1 Ethibond, subcu  was closed with #1 Vicryl and 0 Vicryl, subcutaneous layers were reapproximated with interrupted 0 and 2-0 Vicryl sutures. Skin was closed with 4-0 vicryl  then dermabond. All instruments and sponge counts were correct Mepilex dressing was applied. Knee immobilizer was placed. The patient was then placed in the supine position,  awoken, placed on the operating stretcher, and returned to the  Post anesthesia recovery room in satisfactory condition.    NITKA,JAMES E  07/08/2012 9:52 AM

## 2012-07-08 NOTE — Anesthesia Preprocedure Evaluation (Addendum)
Anesthesia Evaluation  Patient identified by MRN, date of birth, ID band Patient awake    Reviewed: Allergy & Precautions, H&P , NPO status , Patient's Chart, lab work & pertinent test results  History of Anesthesia Complications Negative for: history of anesthetic complications  Airway Mallampati: I TM Distance: >3 FB Neck ROM: Full    Dental  (+) Teeth Intact and Dental Advisory Given   Pulmonary  breath sounds clear to auscultation        Cardiovascular hypertension, Pt. on medications Rhythm:Regular Rate:Normal     Neuro/Psych Depression    GI/Hepatic GERD-  Medicated and Controlled,  Endo/Other    Renal/GU Renal InsufficiencyRenal disease     Musculoskeletal   Abdominal   Peds  Hematology   Anesthesia Other Findings   Reproductive/Obstetrics                          Anesthesia Physical Anesthesia Plan  ASA: II  Anesthesia Plan: General   Post-op Pain Management:    Induction: Intravenous  Airway Management Planned: Oral ETT  Additional Equipment:   Intra-op Plan:   Post-operative Plan: Extubation in OR  Informed Consent: I have reviewed the patients History and Physical, chart, labs and discussed the procedure including the risks, benefits and alternatives for the proposed anesthesia with the patient or authorized representative who has indicated his/her understanding and acceptance.     Plan Discussed with: CRNA and Surgeon  Anesthesia Plan Comments:         Anesthesia Quick Evaluation

## 2012-07-08 NOTE — Anesthesia Procedure Notes (Signed)
Procedure Name: Intubation Date/Time: 07/08/2012 7:55 AM Performed by: Lovie Chol Pre-anesthesia Checklist: Patient identified, Emergency Drugs available, Suction available, Patient being monitored and Timeout performed Patient Re-evaluated:Patient Re-evaluated prior to inductionOxygen Delivery Method: Circle system utilized Preoxygenation: Pre-oxygenation with 100% oxygen Intubation Type: IV induction Ventilation: Mask ventilation without difficulty Laryngoscope Size: Miller and 2 Grade View: Grade I Tube type: Oral Tube size: 7.0 mm Number of attempts: 1 Airway Equipment and Method: Stylet Placement Confirmation: ETT inserted through vocal cords under direct vision,  positive ETCO2,  CO2 detector and breath sounds checked- equal and bilateral Secured at: 20 cm Tube secured with: Tape Dental Injury: Teeth and Oropharynx as per pre-operative assessment

## 2012-07-08 NOTE — Progress Notes (Signed)
Question about an order put in to keep hemovac clamped. PA was called and wants hemovac unclamped.

## 2012-07-09 ENCOUNTER — Encounter (HOSPITAL_COMMUNITY): Payer: Self-pay | Admitting: Specialist

## 2012-07-09 LAB — CBC
HCT: 30.7 % — ABNORMAL LOW (ref 36.0–46.0)
Hemoglobin: 10.4 g/dL — ABNORMAL LOW (ref 12.0–15.0)
RDW: 12.5 % (ref 11.5–15.5)
WBC: 8.7 10*3/uL (ref 4.0–10.5)

## 2012-07-09 LAB — BASIC METABOLIC PANEL
BUN: 22 mg/dL (ref 6–23)
Chloride: 101 mEq/L (ref 96–112)
GFR calc Af Amer: 27 mL/min — ABNORMAL LOW (ref >90)
Glucose, Bld: 122 mg/dL — ABNORMAL HIGH (ref 70–99)
Potassium: 4 mEq/L (ref 3.5–5.1)

## 2012-07-09 MED ORDER — HYDROCODONE-ACETAMINOPHEN 7.5-325 MG PO TABS: 1.0000 | ORAL_TABLET | ORAL | Status: DC | PRN

## 2012-07-09 MED ORDER — ASPIRIN 325 MG PO TBEC: 325.0000 mg | DELAYED_RELEASE_TABLET | Freq: Two times a day (BID) | ORAL | Status: DC

## 2012-07-09 MED ORDER — DSS 100 MG PO CAPS: 100.0000 mg | ORAL_CAPSULE | Freq: Two times a day (BID) | ORAL | Status: DC

## 2012-07-09 MED ORDER — ASPIRIN EC 325 MG PO TBEC
325.0000 mg | DELAYED_RELEASE_TABLET | Freq: Two times a day (BID) | ORAL | Status: DC
  Administered 2012-07-10: 325 mg via ORAL
  Filled 2012-07-09 (×2): qty 1

## 2012-07-09 MED ORDER — FERROUS SULFATE 325 (65 FE) MG PO TABS: 325.0000 mg | ORAL_TABLET | Freq: Two times a day (BID) | ORAL | Status: DC

## 2012-07-09 MED ORDER — ZOLPIDEM TARTRATE 5 MG PO TABS
5.0000 mg | ORAL_TABLET | Freq: Every day | ORAL | Status: DC
Start: 2012-07-09 — End: 2012-12-08

## 2012-07-09 NOTE — Progress Notes (Signed)
Physical Therapy Treatment Patient Details Name: Brenda Werner MRN: [id number] DOB: February 25, 1930 Today's Date: 07/09/2012 Time: 4098-1191 PT Time Calculation (min): 24 min  PT Assessment / Plan / Recommendation Comments on Treatment Session  Patient progressing well with mobility. Need cueing to maintain precuations. MD in during session to pull drain and planning on DC tomorrow if patient continuing to progress well.     Follow Up Recommendations  Home health PT;Supervision/Assistance - 24 hour     Does the patient have the potential to tolerate intense rehabilitation     Barriers to Discharge        Equipment Recommendations  None recommended by PT    Recommendations for Other Services    Frequency 7X/week   Plan Discharge plan remains appropriate;Frequency remains appropriate    Precautions / Restrictions Precautions Precautions: Posterior Hip Precaution Comments: Patient reeducated on all precuations Restrictions LLE Weight Bearing: Weight bearing as tolerated   Pertinent Vitals/Pain     Mobility  Bed Mobility Supine to Sit: 4: Min assist;With rails Sitting - Scoot to Edge of Bed: 4: Min assist Details for Bed Mobility Assistance: A to position LLE off of bed and to elevate shoulders up off of bed. Cues for safety as patient hurrying to EOB Transfers Transfers: Sit to Stand;Stand to Sit Sit to Stand: 4: Min guard;With upper extremity assist;From bed Stand to Sit: 4: Min guard;With upper extremity assist;To chair/3-in-1 Details for Transfer Assistance: Cues for positioning and safe hand placement. Cues for LLE placement. Patient able to stand on her own with increased time. Ambulation/Gait Ambulation/Gait Assistance: 4: Min assist Ambulation Distance (Feet): 90 Feet Assistive device: Rolling walker Ambulation/Gait Assistance Details: Cues for sequence, RW positioning. A for stabililty initially with gait Gait Pattern: Step-through pattern;Decreased stride  length;Trunk flexed    Exercises Total Joint Exercises Quad Sets: AROM;Left;10 reps Heel Slides: AAROM;Left;10 reps Hip ABduction/ADduction: AAROM;Left;10 reps   PT Diagnosis:    PT Problem List:   PT Treatment Interventions:     PT Goals Acute Rehab PT Goals PT Goal: Supine/Side to Sit - Progress: Progressing toward goal PT Goal: Sit to Stand - Progress: Progressing toward goal PT Goal: Ambulate - Progress: Progressing toward goal PT Goal: Perform Home Exercise Program - Progress: Progressing toward goal  Visit Information  Last PT Received On: 07/09/12 Assistance Needed: +1    Subjective Data      Cognition  Overall Cognitive Status: Appears within functional limits for tasks assessed/performed Arousal/Alertness: Awake/alert Orientation Level: Appears intact for tasks assessed Behavior During Session: [facility] for tasks performed Cognition - Other Comments: Patient hard to understand due to garbled speech, encouraged patient to take time and speak slowly    Balance     End of Session PT - End of Session Equipment Utilized During Treatment: Gait belt Activity Tolerance: Patient tolerated treatment well Patient left: in chair;with call bell/phone within reach Nurse Communication: Mobility status   GP     Fredrich Birks 07/09/2012, 8:51 AM 07/09/2012 Fredrich Birks PTA [phone] pager [phone] office

## 2012-07-09 NOTE — Progress Notes (Signed)
PT is recommending home with HH and not SNF.  Clinical Social Worker will sign off for now as social work intervention is no longer needed. Please consult us again if new need arises.   Catrinia Racicot, MSW [phone] 

## 2012-07-09 NOTE — Evaluation (Signed)
Occupational Therapy Evaluation Patient Details Name: Brenda Werner MRN: [id number] DOB: 09-Jun-1930 Today's Date: 07/09/2012 Time: 1191-4782 OT Time Calculation (min): 21 min  OT Assessment / Plan / Recommendation Clinical Impression  78 yo female s/p Lt TKA that coudl benefit from skilled OT acutely. Ot has daughter to assist at d/c. Recommend HHOT.    OT Assessment  Patient needs continued OT Services    Follow Up Recommendations  Home health OT    Barriers to Discharge      Equipment Recommendations  3 in 1 bedside comode;None recommended by OT    Recommendations for Other Services    Frequency  Min 2X/week    Precautions / Restrictions Precautions Precautions: Posterior Hip Precaution Comments: Pt reeducated on hip precautions during Ot session Restrictions LLE Weight Bearing: Weight bearing as tolerated   Pertinent Vitals/Pain Minimal (A)    ADL  Grooming: Wash/dry hands;Min guard Where Assessed - Grooming: Supported standing Lower Body Dressing: +1 Total assistance Where Assessed - Lower Body Dressing: Supported sit to Pharmacist, hospital: Hydrographic surveyor Method: Sit to Barista: Raised toilet seat with arms (or 3-in-1 over toilet) Toileting - Clothing Manipulation and Hygiene: Min guard Where Assessed - Toileting Clothing Manipulation and Hygiene: Sit to stand from 3-in-1 or toilet Equipment Used: Gait belt;Rolling walker Transfers/Ambulation Related to ADLs: pt with decreased gait velocity and min v/c for safety. Pt with posterior Lob during session x 2 during session. Pt able to self correct LOB using ankle strategies. pt moderate fall risk ADL Comments: Pt able to verbalize yes or no 100% correct when provide 5 different LB positions regarding THP. Pt is unable verbalize 3 THP however able to correct identify incorrect body positions    OT Diagnosis: Generalized weakness;Acute pain  OT Problem List: Decreased  strength;Decreased activity tolerance;Impaired balance (sitting and/or standing);Decreased knowledge of use of DME or AE;Decreased safety awareness;Decreased knowledge of precautions;Pain OT Treatment Interventions: Self-care/ADL training;DME and/or AE instruction;Therapeutic activities;Patient/family education;Balance training   OT Goals Acute Rehab OT Goals OT Goal Formulation: With patient Time For Goal Achievement: 07/23/12 Potential to Achieve Goals: Good ADL Goals Pt Will Perform Lower Body Bathing: with modified independence;Sitting, chair;with adaptive equipment ADL Goal: Lower Body Bathing - Progress: Goal set today Pt Will Perform Lower Body Dressing: with modified independence;Sitting, chair;with adaptive equipment ADL Goal: Lower Body Dressing - Progress: Goal set today Pt Will Transfer to Toilet: with set-up;with DME;Ambulation ADL Goal: Toilet Transfer - Progress: Goal set today  Visit Information  Last OT Received On: 07/09/12 Assistance Needed: +1    Subjective Data  Subjective: "Sure why not" pt response to OT asking if a 3n1 transfer could be completed Patient Stated Goal: to go home with daughter helping. Daughter is a Financial planner Living Lives With: Daughter Available Help at Discharge: Family;Available 24 hours/day Type of Home: House Home Access: Stairs to enter [employer] of Steps: 5 Entrance Stairs-Rails: Right;Left Home Layout: One level Bathroom Shower/Tub: Engineer, manufacturing systems: Standard Bathroom Accessibility: Yes How Accessible: Accessible via walker Home Adaptive Equipment: Bedside commode/3-in-1;Shower chair with back;Walker - rolling;Straight cane Prior Function Level of Independence: Independent with assistive device(s);Needs assistance Needs Assistance: Bathing;Meal Prep;Light Housekeeping Bath: Minimal Meal Prep: Maximal Light Housekeeping: Total Able to Take Stairs?: Yes Driving:  No Vocation: Retired Musician: No difficulties Dominant Hand: Right         Vision/Perception     Cognition  Overall Cognitive Status:  Appears within functional limits for tasks assessed/performed Arousal/Alertness: Awake/alert Orientation Level: Appears intact for tasks assessed Behavior During Session: [facility] for tasks performed Cognition - Other Comments: Patient hard to understand due to garbled speech, encouraged patient to take time and speak slowly    Extremity/Trunk Assessment Right Upper Extremity Assessment RUE ROM/Strength/Tone: [facility] for tasks assessed Left Upper Extremity Assessment LUE ROM/Strength/Tone: [facility] for tasks assessed     Mobility Bed Mobility Supine to Sit: 4: Min assist;With rails Sitting - Scoot to Edge of Bed: 4: Min assist Details for Bed Mobility Assistance: (A) with LT LE to progress to EOB. Pt able to use rails and UB to move to EOB Transfers Sit to Stand: 4: Min guard;With upper extremity assist;From bed Stand to Sit: 4: Min guard;With upper extremity assist Details for Transfer Assistance: cues for RW safety and unsteady walking backward     Shoulder Instructions     Exercise     Balance     End of Session OT - End of Session Activity Tolerance: Patient tolerated treatment well Patient left: in chair;with call bell/phone within reach;with family/visitor present Nurse Communication: Mobility status;Precautions  GO     Lucile Shutters 07/09/2012, 3:04 PM Pager: [phone]

## 2012-07-09 NOTE — Progress Notes (Signed)
CARE MANAGEMENT NOTE 07/09/2012  Patient:  Brenda Werner, Brenda Werner   Account Number:  [id number]  Date Initiated:  07/09/2012  Documentation initiated by:  Vance Peper  Subjective/Objective Assessment:   78 yr old female s/p left hip revision     Action/Plan:   CM spoke with patient and family concerning homehealth and DME needs at discharge. Patient preoperatively setup with [facility],no changes. Has rolling walker, 3in1 and a cane. Family support at discharge.   Anticipated DC Date:  07/10/2012   Anticipated DC Plan:  HOME W HOME HEALTH SERVICES      DC Planning Services  CM consult      [facility] Choice  HOME HEALTH   Choice offered to / List presented to:  C-1 Patient        HH arranged  HH-2 PT      [facility] agency  [facility]   Status of service:  Completed, signed off Medicare Important Message given?   (If response is "NO", the following Medicare IM given date fields will be blank) Date Medicare IM given:   Date Additional Medicare IM given:    Discharge Disposition:  HOME W HOME HEALTH SERVICES  Per UR Regulation:    If discussed at Long Length of Stay Meetings, dates discussed:    Comments:

## 2012-07-09 NOTE — Progress Notes (Signed)
Subjective: 1 Day Post-Op Procedure(s) (LRB): TOTAL HIP REVISION (Left) Patient reports pain as moderate.  Physical Therapy working with her already this am.  No acute changes overnight.  Objective: Vital signs in last 24 hours: Temp:  [97.1 F (36.2 C)-98.6 F (37 C)] 98.1 F (36.7 C) (12/24 0542) Pulse Rate:  [57-78] 68  (12/24 0542) Resp:  [12-54] 16  (12/24 0542) BP: (103-152)/(52-99) 118/63 mmHg (12/24 0542) SpO2:  [93 %-100 %] 100 % (12/24 0542)  Intake/Output from previous day: 12/23 0701 - 12/24 0700 In: 3631.7 [P.O.:600; I.V.:2781.7; IV Piggyback:250] Out: 1700 [Urine:1325; Drains:75; Blood:300] Intake/Output this shift:    No results found for this basename: HGB:5 in the last 72 hours No results found for this basename: WBC:2,RBC:2,HCT:2,PLT:2 in the last 72 hours No results found for this basename: NA:2,K:2,CL:2,CO2:2,BUN:2,CREATININE:2,GLUCOSE:2,CALCIUM:2 in the last 72 hours No results found for this basename: LABPT:2,INR:2 in the last 72 hours  Sensation intact distally Intact pulses distally Dorsiflexion/Plantar flexion intact Incision: scant drainage  Assessment/Plan: 1 Day Post-Op Procedure(s) (LRB): TOTAL HIP REVISION (Left) Up with therapy D/C drain. Possible discharge to home with HHPT in the next 1-2 days.  Melah Ebling Y 07/09/2012, 8:35 AM

## 2012-07-09 NOTE — Progress Notes (Signed)
OT Cancellation Note  Patient Details Name: Brenda Werner MRN: [id number] DOB: 12-03-1929   Cancelled Treatment:    Reason Eval/Treat Not Completed: Patient not medically ready (pt currently vomitting and nauseated. RN margaret informed) Pt requesting no therapy at this time. OT will reattempt when patient able to participate in therapy.   Harrel Carina Dupuyer Pager: [phone]  07/09/2012, 11:11 AM

## 2012-07-10 LAB — CBC
HCT: 27.7 % — ABNORMAL LOW (ref 36.0–46.0)
Hemoglobin: 9.7 g/dL — ABNORMAL LOW (ref 12.0–15.0)
MCHC: 35 g/dL (ref 30.0–36.0)

## 2012-07-10 LAB — BASIC METABOLIC PANEL
BUN: 20 mg/dL (ref 6–23)
CO2: 24 mEq/L (ref 19–32)
GFR calc non Af Amer: 24 mL/min — ABNORMAL LOW (ref >90)
Glucose, Bld: 94 mg/dL (ref 70–99)
Potassium: 3.6 mEq/L (ref 3.5–5.1)

## 2012-07-10 NOTE — Progress Notes (Signed)
Physical Therapy Treatment Patient Details Name: Brenda Werner MRN: [id number] DOB: 1930-01-25 Today's Date: 07/10/2012 Time: 4098-1191 PT Time Calculation (min): 18 min  PT Assessment / Plan / Recommendation Comments on Treatment Session  PPatient progressing well, able to complete stairs today. Plan to d/c home with son this afternoon    Follow Up Recommendations  Home health PT;Supervision/Assistance - 24 hour     Does the patient have the potential to tolerate intense rehabilitation     Barriers to Discharge        Equipment Recommendations  None recommended by PT    Recommendations for Other Services    Frequency 7X/week   Plan Discharge plan remains appropriate;Frequency remains appropriate    Precautions / Restrictions Precautions Precautions: Posterior Hip Precaution Comments: pt able to verbalize 2/3 hip precautions Restrictions Weight Bearing Restrictions: Yes LLE Weight Bearing: Weight bearing as tolerated   Pertinent Vitals/Pain No complaints of pain    Mobility  Bed Mobility Bed Mobility: Not assessed Transfers Transfers: Sit to Stand;Stand to Sit Sit to Stand: 4: Min guard;With upper extremity assist;From bed Stand to Sit: 4: Min guard;With upper extremity assist Details for Transfer Assistance: VC for proper hand positioning to/from RW as well as for proper leg placement to maintain hip precautions Ambulation/Gait Ambulation/Gait Assistance: 4: Min guard Ambulation Distance (Feet): 75 Feet Assistive device: Rolling walker Ambulation/Gait Assistance Details: Cues for upright posture. Gait Pattern: Step-through pattern;Decreased stride length;Trunk flexed Gait velocity: slow Stairs: Yes Stairs Assistance: 4: Min guard Stair Management Technique: Two rails;Step to pattern;Forwards Number of Stairs: 5  Wheelchair Mobility Wheelchair Mobility: No    Exercises Total Joint Exercises Heel Slides: AAROM;Left;10 reps Hip ABduction/ADduction:  AAROM;Left;10 reps Straight Leg Raises: AAROM;Strengthening;Left;10 reps;Supine   PT Diagnosis:    PT Problem List:   PT Treatment Interventions:     PT Goals Acute Rehab PT Goals PT Goal: Sit to Stand - Progress: Progressing toward goal PT Goal: Ambulate - Progress: Progressing toward goal PT Goal: Up/Down Stairs - Progress: Progressing toward goal PT Goal: Perform Home Exercise Program - Progress: Progressing toward goal  Visit Information  Last PT Received On: 07/10/12 Assistance Needed: +1    Subjective Data      Cognition  Overall Cognitive Status: Appears within functional limits for tasks assessed/performed Arousal/Alertness: Awake/alert Orientation Level: Appears intact for tasks assessed Behavior During Session: [facility] for tasks performed    Balance     End of Session PT - End of Session Equipment Utilized During Treatment: Gait belt Activity Tolerance: Patient tolerated treatment well Patient left: in chair;with call bell/phone within reach Nurse Communication: Mobility status   GP     Milana Kidney 07/10/2012, 11:58 AM

## 2012-07-10 NOTE — Progress Notes (Signed)
Subjective: 2 Days Post-Op Procedure(s) (LRB): TOTAL HIP REVISION (Left) Patient reports pain as moderate.  Asymptomatic acute blood loss anemia.  Working well with therapy.  Objective: Vital signs in last 24 hours: Temp:  [98.9 F (37.2 C)-99.2 F (37.3 C)] 98.9 F (37.2 C) (12/24 2333) Pulse Rate:  [79-83] 79  (12/24 2333) Resp:  [13-20] 20  (12/24 2333) BP: (128-129)/(57-66) 128/66 mmHg (12/24 2333) SpO2:  [96 %-99 %] 96 % (12/24 2333) Weight:  [65.8 kg (145 lb 1 oz)] 65.8 kg (145 lb 1 oz) (12/24 1100)  Intake/Output from previous day: 12/24 0701 - 12/25 0700 In: 1640 [P.O.:1440; I.V.:200] Out: 1 [Urine:1] Intake/Output this shift:     Basename 07/10/12 0520 07/09/12 0704  HGB 9.7* 10.4*    Basename 07/10/12 0520 07/09/12 0704  WBC 8.1 8.7  RBC 2.99* 3.29*  HCT 27.7* 30.7*  PLT 106* 121*    Basename 07/10/12 0520 07/09/12 0704  NA 135 136  K 3.6 4.0  CL 101 101  CO2 24 24  BUN 20 22  CREATININE 1.92* 1.91*  GLUCOSE 94 122*  CALCIUM 9.4 9.3   No results found for this basename: LABPT:2,INR:2 in the last 72 hours  Sensation intact distally Intact pulses distally Dorsiflexion/Plantar flexion intact Incision: no drainage No cellulitis present  Assessment/Plan: 2 Days Post-Op Procedure(s) (LRB): TOTAL HIP REVISION (Left) Discharge home with home health  Kathryne Hitch 07/10/2012, 10:33 AM

## 2012-07-10 NOTE — Discharge Summary (Signed)
Patient ID: Brenda Werner MRN: [id number] DOB/AGE: May 25, 1930 78 y.o.  Admit date: 07/08/2012 Discharge date: 07/10/2012  Admission Diagnoses:  Principal Problem:  *Pain due to total hip replacement   Discharge Diagnoses:  Same  Past Medical History  Diagnosis Date  . Anxiety   . Arthritis   . Cataract     had surgery  . Depression   . GERD (gastroesophageal reflux disease)   . Heart murmur   . Irregular heart beat     doesn't know type  . Hyperlipidemia   . Hypertension     Surgeries: Procedure(s): TOTAL HIP REVISION on 07/08/2012   Consultants:    Discharged Condition: Improved  Hospital Course: Brenda Werner is an 78 y.o. female who was admitted 07/08/2012 for operative treatment ofPain due to total hip replacement. Patient has severe unremitting pain that affects sleep, daily activities, and work/hobbies. After pre-op clearance the patient was taken to the operating room on 07/08/2012 and underwent  Procedure(s): TOTAL HIP REVISION.    Patient was given perioperative antibiotics: Anti-infectives     Start     Dose/Rate Route Frequency Ordered Stop   07/07/12 1122   vancomycin (VANCOCIN) IVPB 1000 mg/200 mL premix        1,000 mg 200 mL/hr over 60 Minutes Intravenous 60 min pre-op 07/07/12 1122 07/08/12 0740           Patient was given sequential compression devices, early ambulation, and chemoprophylaxis to prevent DVT.  Patient benefited maximally from hospital stay and there were no complications.    Recent vital signs: Patient Vitals for the past 24 hrs:  BP Temp Pulse Resp SpO2 Height Weight  08/03/12 2333 128/66 mmHg 98.9 F (37.2 C) 79  20  96 % - -  2012/08/03 1600 - - - 13  99 % - -  2012-08-03 1330 129/57 mmHg 99.2 F (37.3 C) 83  16  99 % - -  03-Aug-2012 1100 - - - - - 5\' 4"  (1.626 m) 65.8 kg (145 lb 1 oz)     Recent laboratory studies:  [facility] 07/10/12 0520 Aug 03, 2012 0704  WBC 8.1 8.7  HGB 9.7* 10.4*  HCT 27.7* 30.7*  PLT 106*  121*  NA 135 136  K 3.6 4.0  CL 101 101  CO2 24 24  BUN 20 22  CREATININE 1.92* 1.91*  GLUCOSE 94 122*  INR -- --  CALCIUM 9.4 --     Discharge Medications:     Medication List     As of 07/10/2012 10:35 AM    TAKE these medications         acetaminophen 500 MG tablet   Commonly known as: TYLENOL   Take 500 mg by mouth every 6 (six) hours as needed. For pain      aspirin 325 MG EC tablet   Take 1 tablet (325 mg total) by mouth 2 (two) times daily with a meal.      atorvastatin 20 MG tablet   Commonly known as: LIPITOR   Take 20 mg by mouth every evening.      buPROPion 150 MG 12 hr tablet   Commonly known as: WELLBUTRIN SR   Take 150 mg by mouth every morning.      diltiazem 300 MG 24 hr capsule   Commonly known as: CARDIZEM CD   Take 300 mg by mouth daily.      DSS 100 MG Caps   Take 100 mg by mouth 2 (two) times daily.  ferrous sulfate 325 (65 FE) MG tablet   Take 1 tablet (325 mg total) by mouth 2 (two) times daily with a meal.      gabapentin 300 MG capsule   Commonly known as: NEURONTIN   Take 300 mg by mouth 3 (three) times daily.      GLUCOSAMINE CHONDR COMPLEX PO   Take 1 tablet by mouth 2 (two) times daily.      HYDROcodone-acetaminophen 7.5-325 MG per tablet   Commonly known as: NORCO   Take 1 tablet by mouth every 4 (four) hours as needed for pain.      HYDROcodone-acetaminophen 5-325 MG per tablet   Commonly known as: NORCO/VICODIN   Take 1 tablet by mouth every 4 (four) hours as needed.      Olopatadine HCl 0.2 % Soln   Place 1 drop into both eyes daily.      omeprazole 20 MG capsule   Commonly known as: PRILOSEC   Take 20 mg by mouth 2 (two) times daily.      potassium chloride 10 MEQ CR capsule   Commonly known as: MICRO-K   Take 10 mEq by mouth 2 (two) times daily.      triamterene-hydrochlorothiazide 75-50 MG per tablet   Commonly known as: MAXZIDE   Take 1 tablet by mouth every morning.      zolpidem 5 MG tablet    Commonly known as: AMBIEN   Take 1 tablet (5 mg total) by mouth at bedtime.        Diagnostic Studies: Dg Chest 2 View  07/04/2012  *RADIOLOGY REPORT*  Clinical Data: Pre operative lumbar laminectomy/decompression  CHEST - 2 VIEW  Comparison: 01/19/2010  Findings: Heart and mediastinal contours are stable with aortic ectasia an aortic calcification unchanged.  Diffuse coarse interstitial markings are seen throughout both lung fields and are compatible with underlying bronchitic change and this finding is stable.  No pleural fluid or new focal infiltrate is seen.  No signs of congestive failure are noted.  Stable bilateral apical pleural thickening is identified.  Bony structures demonstrate postoperative change of the right humeral head and degenerative osteophytosis of the mid thoracic spine and upper lumbar spine.  IMPRESSION: Stable cardiopulmonary appearance with no new focal or acute abnormality identified   Original Report Authenticated By: Rhodia Albright, M.D.    Dg Pelvis Portable  07/08/2012  *RADIOLOGY REPORT*  Clinical Data: Left hip replacement  PORTABLE PELVIS  Comparison: 03/10/2012  Findings: Left hip replacement has been revised since the prior study.  Satisfactory position of the femoral and acetabular components.  No fracture or acute abnormality.  IUD is noted in the pelvis.  IMPRESSION: Satisfactory left hip replacement.   Original Report Authenticated By: Janeece Riggers, M.D.    Dg Hip Portable 1 View Left  07/08/2012  *RADIOLOGY REPORT*  Clinical Data: Left hip replacement  PORTABLE LEFT HIP - 1 VIEW  Comparison: 07/08/2012, 03/19/2007  Findings: Lateral view of the left hip reveals satisfactory alignment of left hip replacement.  No fracture or acute complication.  IMPRESSION: Normal alignment left hip replacement.   Original Report Authenticated By: Janeece Riggers, M.D.     Disposition: 01-Home or Self Care      Discharge Orders    Future Orders Please Complete By Expires    Diet - low sodium heart healthy      Diet - low sodium heart healthy      Call MD / Call 911      Comments:  If you experience chest pain or shortness of breath, CALL 911 and be transported to the hospital emergency room.  If you develope a fever above 101 F, pus (white drainage) or increased drainage or redness at the wound, or calf pain, call your surgeon's office.   Constipation Prevention      Comments:   Drink plenty of fluids.  Prune juice may be helpful.  You may use a stool softener, such as Colace (over the counter) 100 mg twice a day.  Use MiraLax (over the counter) for constipation as needed.   Increase activity slowly as tolerated      Discharge instructions      Comments:   Keep hip incision dry for 5 days post op then may wet while bathing. Therapy daily . Call if fever or chills or increased drainage. Go to ER if acutely short of breath or call for ambulance. Return for follow up in 2 weeks. May full weight bear on the surgical leg unless told otherwise. In house walking for first 2 weeks.   Driving restrictions      Comments:   No driving.   Lifting restrictions      Comments:   No lifting for 6 weeks   Follow the hip precautions as taught in Physical Therapy      Change dressing      Comments:   You may change your dressing on 07/12/2012, then change the dressing daily with sterile 4 x 4 inch gauze dressing and paper tape.  You may clean the incision with alcohol prior to redressing   Call MD / Call 911      Comments:   If you experience chest pain or shortness of breath, CALL 911 and be transported to the hospital emergency room.  If you develope a fever above 101 F, pus (white drainage) or increased drainage or redness at the wound, or calf pain, call your surgeon's office.   Constipation Prevention      Comments:   Drink plenty of fluids.  Prune juice may be helpful.  You may use a stool softener, such as Colace (over the counter) 100 mg twice a day.  Use MiraLax  (over the counter) for constipation as needed.   Increase activity slowly as tolerated      Discharge patient         Follow-up Information    Follow up with NITKA,JAMES E, MD. In 2 weeks.   Contact information:   [address] [place] [address] [phone]           Signed: Kathryne Hitch 07/10/2012, 10:35 AM

## 2012-07-11 LAB — BODY FLUID CULTURE: Gram Stain: NONE SEEN

## 2012-07-31 ENCOUNTER — Ambulatory Visit: Payer: Medicare Other

## 2012-08-06 ENCOUNTER — Ambulatory Visit: Payer: Medicare Other | Attending: Specialist

## 2012-08-06 DIAGNOSIS — M25519 Pain in unspecified shoulder: Secondary | ICD-10-CM | POA: Insufficient documentation

## 2012-08-06 DIAGNOSIS — IMO0001 Reserved for inherently not codable concepts without codable children: Secondary | ICD-10-CM | POA: Insufficient documentation

## 2012-08-06 DIAGNOSIS — M6281 Muscle weakness (generalized): Secondary | ICD-10-CM | POA: Insufficient documentation

## 2012-08-12 ENCOUNTER — Ambulatory Visit: Payer: Medicare Other | Admitting: Rehabilitative and Restorative Service Providers"

## 2012-08-15 ENCOUNTER — Ambulatory Visit: Payer: Medicare Other | Admitting: Physical Therapy

## 2012-08-19 ENCOUNTER — Ambulatory Visit: Payer: Medicare Other | Attending: Specialist | Admitting: Rehabilitative and Restorative Service Providers"

## 2012-08-19 DIAGNOSIS — M25519 Pain in unspecified shoulder: Secondary | ICD-10-CM | POA: Insufficient documentation

## 2012-08-19 DIAGNOSIS — IMO0001 Reserved for inherently not codable concepts without codable children: Secondary | ICD-10-CM | POA: Insufficient documentation

## 2012-08-19 DIAGNOSIS — M6281 Muscle weakness (generalized): Secondary | ICD-10-CM | POA: Insufficient documentation

## 2012-08-21 ENCOUNTER — Ambulatory Visit: Payer: Medicare Other | Admitting: Rehabilitative and Restorative Service Providers"

## 2012-08-28 ENCOUNTER — Ambulatory Visit: Payer: Medicare Other | Admitting: Rehabilitative and Restorative Service Providers"

## 2012-08-29 ENCOUNTER — Ambulatory Visit: Payer: Medicare Other | Admitting: Physical Therapy

## 2012-09-02 ENCOUNTER — Ambulatory Visit: Payer: Medicare Other | Admitting: Physical Therapy

## 2012-09-04 ENCOUNTER — Ambulatory Visit: Payer: Medicare Other | Admitting: Physical Therapy

## 2012-09-09 ENCOUNTER — Ambulatory Visit: Payer: Medicare Other | Admitting: Physical Therapy

## 2012-09-11 ENCOUNTER — Ambulatory Visit: Payer: Medicare Other | Admitting: Physical Therapy

## 2012-12-08 ENCOUNTER — Emergency Department (HOSPITAL_COMMUNITY): Payer: Medicare (Managed Care)

## 2012-12-08 ENCOUNTER — Emergency Department (HOSPITAL_COMMUNITY)
Admission: EM | Admit: 2012-12-08 | Discharge: 2012-12-08 | Disposition: A | Discharge status: Home / Self Care | Payer: Medicare (Managed Care) | Attending: Emergency Medicine | Admitting: Emergency Medicine

## 2012-12-08 ENCOUNTER — Encounter (HOSPITAL_COMMUNITY): Payer: Self-pay | Admitting: Physical Medicine and Rehabilitation

## 2012-12-08 DIAGNOSIS — E785 Hyperlipidemia, unspecified: Secondary | ICD-10-CM | POA: Insufficient documentation

## 2012-12-08 DIAGNOSIS — I10 Essential (primary) hypertension: Secondary | ICD-10-CM | POA: Insufficient documentation

## 2012-12-08 DIAGNOSIS — R011 Cardiac murmur, unspecified: Secondary | ICD-10-CM | POA: Insufficient documentation

## 2012-12-08 DIAGNOSIS — I499 Cardiac arrhythmia, unspecified: Secondary | ICD-10-CM | POA: Insufficient documentation

## 2012-12-08 DIAGNOSIS — K219 Gastro-esophageal reflux disease without esophagitis: Secondary | ICD-10-CM | POA: Insufficient documentation

## 2012-12-08 DIAGNOSIS — F411 Generalized anxiety disorder: Secondary | ICD-10-CM | POA: Insufficient documentation

## 2012-12-08 DIAGNOSIS — Z91040 Latex allergy status: Secondary | ICD-10-CM | POA: Insufficient documentation

## 2012-12-08 DIAGNOSIS — R51 Headache: Secondary | ICD-10-CM | POA: Insufficient documentation

## 2012-12-08 DIAGNOSIS — M129 Arthropathy, unspecified: Secondary | ICD-10-CM | POA: Insufficient documentation

## 2012-12-08 DIAGNOSIS — F329 Major depressive disorder, single episode, unspecified: Secondary | ICD-10-CM | POA: Insufficient documentation

## 2012-12-08 DIAGNOSIS — F3289 Other specified depressive episodes: Secondary | ICD-10-CM | POA: Insufficient documentation

## 2012-12-08 DIAGNOSIS — J3489 Other specified disorders of nose and nasal sinuses: Secondary | ICD-10-CM | POA: Insufficient documentation

## 2012-12-08 DIAGNOSIS — Z880 Allergy status to penicillin: Secondary | ICD-10-CM | POA: Insufficient documentation

## 2012-12-08 DIAGNOSIS — Z7982 Long term (current) use of aspirin: Secondary | ICD-10-CM | POA: Insufficient documentation

## 2012-12-08 DIAGNOSIS — Z79899 Other long term (current) drug therapy: Secondary | ICD-10-CM | POA: Insufficient documentation

## 2012-12-08 MED ORDER — IBUPROFEN 400 MG PO TABS: 400.0000 mg | ORAL_TABLET | Freq: Four times a day (QID) | ORAL | Status: DC | PRN

## 2012-12-08 MED ORDER — OXYMETAZOLINE HCL 0.05 % NA SOLN: 2.0000 | Freq: Two times a day (BID) | NASAL | Status: DC

## 2012-12-08 MED ORDER — LORATADINE 10 MG PO TABS: 10.0000 mg | ORAL_TABLET | Freq: Every day | ORAL | Status: DC

## 2012-12-08 MED ORDER — MOMETASONE FUROATE 50 MCG/ACT NA SUSP: 2.0000 | Freq: Every day | NASAL | Status: DC

## 2012-12-08 NOTE — ED Notes (Signed)
Pt presents to department for evaluation of headache. Pt states pressure to L eye and behind L ear. Onset yesterday. 8/10 pain at present. Denies blurred vision, no nausea/vomiting. She is conscious alert and oriented x4. No signs of acute distress noted.

## 2012-12-08 NOTE — ED Notes (Signed)
Pt discharged home, daughter at bedside to transport. Had no further questions. Vital signs stable.

## 2012-12-08 NOTE — ED Provider Notes (Signed)
History     CSN: [id number]  Arrival date & time 12/08/12  0865   First MD Initiated Contact with Patient 12/08/12 [phone]      Chief Complaint  Patient presents with  . Headache    (Consider location/radiation/quality/duration/timing/severity/associated sxs/prior treatment) HPI Pt with new onset left-sided headache that was gradual onset starting on Friday. +nasal congestion. Pain is throbbing and around L eye and radiates behind L ear. No fever or chills. No trauma. No ear pain. No focal weakness, numbness or visual changes. No neck pain or stiffness. Pt states she does not normally have headaches.  Past Medical History  Diagnosis Date  . Anxiety   . Arthritis   . Cataract     had surgery  . Depression   . GERD (gastroesophageal reflux disease)   . Heart murmur   . Irregular heart beat     doesn't know type  . Hyperlipidemia   . Hypertension     Past Surgical History  Procedure Laterality Date  . Shoulder arthroscopy      twice on right  . Hip surgery      replacement, left  . Replacement total knee      right  . Foot fracture surgery      at ankle, left  . Joint replacement    . Fracture surgery    . Eye surgery    . Cataract extraction w/ intraocular lens  implant, bilateral  2013  . Total hip revision  07/08/2012    Procedure: TOTAL HIP REVISION;  Surgeon: Kerrin Champagne, MD;  Location: [facility] OR;  Service: Orthopedics;  Laterality: Left;  Revision of left hip ASR to Gryption acetabulum with poly and ceramic femoral head, removal of ASR head and acetabular shell    Family History  Problem Relation Age of Onset  . Heart disease Father   . Esophageal cancer Sister     History  Substance Use Topics  . Smoking status: Never Smoker   . Smokeless tobacco: Never Used  . Alcohol Use: No    OB History   Grav Para Term Preterm Abortions TAB SAB Ect Mult Living                  Review of Systems  Constitutional: Negative for fever and chills.  HENT: Positive  for congestion and sinus pressure. Negative for ear pain, sore throat, neck pain and neck stiffness.   Eyes: Negative for photophobia and visual disturbance.  Respiratory: Negative for shortness of breath and wheezing.   Cardiovascular: Negative for chest pain and palpitations.  Gastrointestinal: Negative for nausea, vomiting and abdominal pain.  Skin: Negative for rash and wound.  Neurological: Positive for headaches. Negative for dizziness, syncope, light-headedness and numbness.  All other systems reviewed and are negative.    Allergies  Penicillins and Latex  Home Medications   Current Outpatient Rx  Name  Route  Sig  Dispense  Refill  . aspirin 81 MG chewable tablet   Oral   Chew 81 mg by mouth daily.         Marland Kitchen atorvastatin (LIPITOR) 20 MG tablet   Oral   Take 20 mg by mouth every evening.          . Cholecalciferol 1000 UNITS tablet   Oral   Take 1,000 Units by mouth daily.         . cyanocobalamin 100 MCG tablet   Oral   Take 100 mcg by mouth daily.         Marland Kitchen  diltiazem (CARDIZEM CD) 300 MG 24 hr capsule   Oral   Take 300 mg by mouth daily.         . mirtazapine (REMERON) 15 MG tablet   Oral   Take 15 mg by mouth at bedtime.         . Olopatadine HCl 0.2 % SOLN   Both Eyes   Place 1 drop into both eyes daily.          Marland Kitchen omeprazole (PRILOSEC) 20 MG capsule   Oral   Take 20 mg by mouth daily.          . potassium chloride (MICRO-K) 10 MEQ CR capsule   Oral   Take 10 mEq by mouth 2 (two) times daily.         . traMADol (ULTRAM) 50 MG tablet   Oral   Take 50 mg by mouth every 6 (six) hours as needed for pain.         Marland Kitchen triamterene-hydrochlorothiazide (MAXZIDE) 75-50 MG per tablet   Oral   Take 1 tablet by mouth every morning.          Marland Kitchen ibuprofen (ADVIL,MOTRIN) 400 MG tablet   Oral   Take 1 tablet (400 mg total) by mouth every 6 (six) hours as needed for pain.   30 tablet   0   . loratadine (CLARITIN) 10 MG tablet   Oral    Take 1 tablet (10 mg total) by mouth daily. One po daily x 5 days   5 tablet   0   . mometasone (NASONEX) 50 MCG/ACT nasal spray   Nasal   Place 2 sprays into the nose daily.   17 g   12   . oxymetazoline (AFRIN NASAL SPRAY) 0.05 % nasal spray   Nasal   Place 2 sprays into the nose 2 (two) times daily.   30 mL   0     BP 129/72  Pulse 89  Temp(Src) 97.3 F (36.3 C) (Oral)  Resp 16  SpO2 99%  Physical Exam  Nursing note and vitals reviewed. Constitutional: She is oriented to person, place, and time. She appears well-developed and well-nourished. No distress.  HENT:  Head: Normocephalic and atraumatic.  Right Ear: External ear normal.  Left Ear: External ear normal.  Mouth/Throat: Oropharynx is clear and moist.  Tenderness to percussion over l frontal and maxillary sinus. No temporal artery tenderness. No mastoid tenderness.   Eyes: EOM are normal. Pupils are equal, round, and reactive to light.  Neck: Normal range of motion. Neck supple.  No meningismus   Cardiovascular: Normal rate and regular rhythm.   Pulmonary/Chest: Effort normal and breath sounds normal. No respiratory distress. She has no wheezes. She has no rales.  Abdominal: Soft. Bowel sounds are normal. She exhibits no distension and no mass. There is no tenderness. There is no rebound and no guarding.  Musculoskeletal: Normal range of motion. She exhibits no edema and no tenderness.  Neurological: She is alert and oriented to person, place, and time.  5/5 motor in all ext, sensation intact, finger to nose intact  Skin: Skin is warm and dry. No rash noted. No erythema.  Psychiatric: She has a normal mood and affect. Her behavior is normal.    ED Course  Procedures (including critical care time)  Labs Reviewed - No data to display Ct Head Wo Contrast  12/08/2012   *RADIOLOGY REPORT*  Clinical Data:  Headache. Left eye and ear pain.  CT HEAD  WITHOUT CONTRAST CT MAXILLOFACIAL WITHOUT CONTRAST  Technique:   Multidetector CT imaging of the head and maxillofacial structures were performed using the standard protocol without intravenous contrast. Multiplanar CT image reconstructions of the maxillofacial structures were also generated.  Comparison:   None.  CT HEAD  Findings: Chronic ischemic changes in the periventricular white matter.  Low density area in the left basil ganglia either represents lacunar infarct or dilated perivascular space.  No mass effect, midline shift, or acute intracranial hemorrhage. Atherosclerotic changes and ectasia of the internal carotid arteries.  Intact cranium.  Mastoid air cells are clear. Mucosal thickening in the ethmoid air cells is noted.  IMPRESSION: No acute intracranial pathology.  CT MAXILLOFACIAL  Findings:   No evidence of facial bone fracture.  Postoperative changes from functional endoscopic surgery in the sinuses has been performed.  Minimal mucosal thickening in the anterior ethmoid air cells and left frontal sinus.  Ectatic vasculature causes mass effect upon the right side of the oropharynx.  No evidence of abscess formation.  Degenerative changes in the cervical spine are noted.  This consists of marked narrowing of the C4-5, C5-6, and C6- 7 disc spaces.  IMPRESSION: No evidence of facial bone injury.  Mild inflammatory changes in the paranasal sinuses.   Original Report Authenticated By: Jolaine Click, M.D.   Ct Maxillofacial Wo Cm  12/08/2012   *RADIOLOGY REPORT*  Clinical Data:  Headache. Left eye and ear pain.  CT HEAD WITHOUT CONTRAST CT MAXILLOFACIAL WITHOUT CONTRAST  Technique:  Multidetector CT imaging of the head and maxillofacial structures were performed using the standard protocol without intravenous contrast. Multiplanar CT image reconstructions of the maxillofacial structures were also generated.  Comparison:   None.  CT HEAD  Findings: Chronic ischemic changes in the periventricular white matter.  Low density area in the left basil ganglia either represents  lacunar infarct or dilated perivascular space.  No mass effect, midline shift, or acute intracranial hemorrhage. Atherosclerotic changes and ectasia of the internal carotid arteries.  Intact cranium.  Mastoid air cells are clear. Mucosal thickening in the ethmoid air cells is noted.  IMPRESSION: No acute intracranial pathology.  CT MAXILLOFACIAL  Findings:   No evidence of facial bone fracture.  Postoperative changes from functional endoscopic surgery in the sinuses has been performed.  Minimal mucosal thickening in the anterior ethmoid air cells and left frontal sinus.  Ectatic vasculature causes mass effect upon the right side of the oropharynx.  No evidence of abscess formation.  Degenerative changes in the cervical spine are noted.  This consists of marked narrowing of the C4-5, C5-6, and C6- 7 disc spaces.  IMPRESSION: No evidence of facial bone injury.  Mild inflammatory changes in the paranasal sinuses.   Original Report Authenticated By: Jolaine Click, M.D.     1. Sinus headache       MDM  Suspect HA due to sinus disease but will scan brain given age and new onset HA. No concerning historical or physical elements for [facility].   Pt resting comfortably. CT with mild inflammatory changes to L frontal and ethmoid sinuses.   Will treat for sinus inflammation. Advised to f/u with PMD early this week to assure resolution and return precautions given.       Loren Racer, MD 12/08/12 1153

## 2014-03-08 ENCOUNTER — Encounter (HOSPITAL_COMMUNITY): Payer: Self-pay | Admitting: Emergency Medicine

## 2014-03-08 ENCOUNTER — Emergency Department (HOSPITAL_COMMUNITY)
Admission: EM | Admit: 2014-03-08 | Discharge: 2014-03-08 | Disposition: A | Discharge status: Home / Self Care | Payer: Medicare (Managed Care) | Attending: Emergency Medicine | Admitting: Emergency Medicine

## 2014-03-08 DIAGNOSIS — H269 Unspecified cataract: Secondary | ICD-10-CM | POA: Diagnosis not present

## 2014-03-08 DIAGNOSIS — F3289 Other specified depressive episodes: Secondary | ICD-10-CM | POA: Diagnosis not present

## 2014-03-08 DIAGNOSIS — I10 Essential (primary) hypertension: Secondary | ICD-10-CM | POA: Diagnosis not present

## 2014-03-08 DIAGNOSIS — M129 Arthropathy, unspecified: Secondary | ICD-10-CM | POA: Insufficient documentation

## 2014-03-08 DIAGNOSIS — F411 Generalized anxiety disorder: Secondary | ICD-10-CM | POA: Diagnosis not present

## 2014-03-08 DIAGNOSIS — IMO0002 Reserved for concepts with insufficient information to code with codable children: Secondary | ICD-10-CM | POA: Insufficient documentation

## 2014-03-08 DIAGNOSIS — F329 Major depressive disorder, single episode, unspecified: Secondary | ICD-10-CM | POA: Insufficient documentation

## 2014-03-08 DIAGNOSIS — K219 Gastro-esophageal reflux disease without esophagitis: Secondary | ICD-10-CM | POA: Diagnosis not present

## 2014-03-08 DIAGNOSIS — Z7982 Long term (current) use of aspirin: Secondary | ICD-10-CM | POA: Diagnosis not present

## 2014-03-08 DIAGNOSIS — Z91040 Latex allergy status: Secondary | ICD-10-CM | POA: Diagnosis not present

## 2014-03-08 DIAGNOSIS — R011 Cardiac murmur, unspecified: Secondary | ICD-10-CM | POA: Insufficient documentation

## 2014-03-08 DIAGNOSIS — M25562 Pain in left knee: Secondary | ICD-10-CM

## 2014-03-08 DIAGNOSIS — M25569 Pain in unspecified knee: Secondary | ICD-10-CM | POA: Insufficient documentation

## 2014-03-08 DIAGNOSIS — E785 Hyperlipidemia, unspecified: Secondary | ICD-10-CM | POA: Insufficient documentation

## 2014-03-08 DIAGNOSIS — Z79899 Other long term (current) drug therapy: Secondary | ICD-10-CM | POA: Diagnosis not present

## 2014-03-08 DIAGNOSIS — G8929 Other chronic pain: Secondary | ICD-10-CM | POA: Insufficient documentation

## 2014-03-08 DIAGNOSIS — Z880 Allergy status to penicillin: Secondary | ICD-10-CM | POA: Diagnosis not present

## 2014-03-08 NOTE — Discharge Instructions (Signed)
Arthritis, Nonspecific °Arthritis is inflammation of a joint. This usually means pain, redness, warmth or swelling are present. One or more joints may be involved. There are a number of types of arthritis. Your caregiver may not be able to tell what type of arthritis you have right away. °CAUSES  °The most common cause of arthritis is the wear and tear on the joint (osteoarthritis). This causes damage to the cartilage, which can break down over time. The knees, hips, back and neck are most often affected by this type of arthritis. °Other types of arthritis and common causes of joint pain include: °· Sprains and other injuries near the joint. Sometimes minor sprains and injuries cause pain and swelling that develop hours later. °· Rheumatoid arthritis. This affects hands, feet and knees. It usually affects both sides of your body at the same time. It is often associated with chronic ailments, fever, weight loss and general weakness. °· Crystal arthritis. Gout and pseudo gout can cause occasional acute severe pain, redness and swelling in the foot, ankle, or knee. °· Infectious arthritis. Bacteria can get into a joint through a break in overlying skin. This can cause infection of the joint. Bacteria and viruses can also spread through the blood and affect your joints. °· Drug, infectious and allergy reactions. Sometimes joints can become mildly painful and slightly swollen with these types of illnesses. °SYMPTOMS  °· Pain is the main symptom. °· Your joint or joints can also be red, swollen and warm or hot to the touch. °· You may have a fever with certain types of arthritis, or even feel overall ill. °· The joint with arthritis will hurt with movement. Stiffness is present with some types of arthritis. °DIAGNOSIS  °Your caregiver will suspect arthritis based on your description of your symptoms and on your exam. Testing may be needed to find the type of arthritis: °· Blood and sometimes urine tests. °· X-ray tests  and sometimes CT or MRI scans. °· Removal of fluid from the joint (arthrocentesis) is done to check for bacteria, crystals or other causes. Your caregiver (or a specialist) will numb the area over the joint with a local anesthetic, and use a needle to remove joint fluid for examination. This procedure is only minimally uncomfortable. °· Even with these tests, your caregiver may not be able to tell what kind of arthritis you have. Consultation with a specialist (rheumatologist) may be helpful. °TREATMENT  °Your caregiver will discuss with you treatment specific to your type of arthritis. If the specific type cannot be determined, then the following general recommendations may apply. °Treatment of severe joint pain includes: °· Rest. °· Elevation. °· Anti-inflammatory medication (for example, ibuprofen) may be prescribed. Avoiding activities that cause increased pain. °· Only take over-the-counter or prescription medicines for pain and discomfort as recommended by your caregiver. °· Cold packs over an inflamed joint may be used for 10 to 15 minutes every hour. Hot packs sometimes feel better, but do not use overnight. Do not use hot packs if you are diabetic without your caregiver's permission. °· A cortisone shot into arthritic joints may help reduce pain and swelling. °· Any acute arthritis that gets worse over the next 1 to 2 days needs to be looked at to be sure there is no joint infection. °Long-term arthritis treatment involves modifying activities and lifestyle to reduce joint stress jarring. This can include weight loss. Also, exercise is needed to nourish the joint cartilage and remove waste. This helps keep the muscles   around the joint strong. °HOME CARE INSTRUCTIONS  °· Do not take aspirin to relieve pain if gout is suspected. This elevates uric acid levels. °· Only take over-the-counter or prescription medicines for pain, discomfort or fever as directed by your caregiver. °· Rest the joint as much as  possible. °· If your joint is swollen, keep it elevated. °· Use crutches if the painful joint is in your leg. °· Drinking plenty of fluids may help for certain types of arthritis. °· Follow your caregiver's dietary instructions. °· Try low-impact exercise such as: °¨ Swimming. °¨ Water aerobics. °¨ Biking. °¨ Walking. °· Morning stiffness is often relieved by a warm shower. °· Put your joints through regular range-of-motion. °SEEK MEDICAL CARE IF:  °· You do not feel better in 24 hours or are getting worse. °· You have side effects to medications, or are not getting better with treatment. °SEEK IMMEDIATE MEDICAL CARE IF:  °· You have a fever. °· You develop severe joint pain, swelling or redness. °· Many joints are involved and become painful and swollen. °· There is severe back pain and/or leg weakness. °· You have loss of bowel or bladder control. °Document Released: 08/10/2004 Document Revised: 09/25/2011 Document Reviewed: 08/26/2008 °[employer]® Patient Information ©2015 [employer]. This information is not intended to replace advice given to you by your health care provider. Make sure you discuss any questions you have with your health care provider. ° °Knee Pain °The knee is the complex joint between your thigh and your lower leg. It is made up of bones, tendons, ligaments, and cartilage. The bones that make up the knee are: °· The femur in the thigh. °· The tibia and fibula in the lower leg. °· The patella or kneecap riding in the groove on the lower femur. °CAUSES  °Knee pain is a common complaint with many causes. A few of these causes are: °· Injury, such as: °¨ A ruptured ligament or tendon injury. °¨ Torn cartilage. °· Medical conditions, such as: °¨ Gout °¨ Arthritis °¨ Infections °· Overuse, over training, or overdoing a physical activity. °Knee pain can be minor or severe. Knee pain can accompany debilitating injury. Minor knee problems often respond well to self-care measures or get well on their  own. More serious injuries may need medical intervention or even surgery. °SYMPTOMS °The knee is complex. Symptoms of knee problems can vary widely. Some of the problems are: °· Pain with movement and weight bearing. °· Swelling and tenderness. °· Buckling of the knee. °· Inability to straighten or extend your knee. °· Your knee locks and you cannot straighten it. °· Warmth and redness with pain and fever. °· Deformity or dislocation of the kneecap. °DIAGNOSIS  °Determining what is wrong may be very straight forward such as when there is an injury. It can also be challenging because of the complexity of the knee. Tests to make a diagnosis may include: °· Your caregiver taking a history and doing a physical exam. °· Routine X-rays can be used to rule out other problems. X-rays will not reveal a cartilage tear. Some injuries of the knee can be diagnosed by: °¨ Arthroscopy a surgical technique by which a small video camera is inserted through tiny incisions on the sides of the knee. This procedure is used to examine and repair internal knee joint problems. Tiny instruments can be used during arthroscopy to repair the torn knee cartilage (meniscus). °¨ Arthrography is a radiology technique. A contrast liquid is directly injected into the knee   joint. Internal structures of the knee joint then become visible on X-ray film. °¨ An MRI scan is a non X-ray radiology procedure in which magnetic fields and a computer produce two- or three-dimensional images of the inside of the knee. Cartilage tears are often visible using an MRI scanner. MRI scans have largely replaced arthrography in diagnosing cartilage tears of the knee. °· Blood work. °· Examination of the fluid that helps to lubricate the knee joint (synovial fluid). This is done by taking a sample out using a needle and a syringe. °TREATMENT °The treatment of knee problems depends on the cause. Some of these treatments are: °· Depending on the injury, proper casting,  splinting, surgery, or physical therapy care will be needed. °· Give yourself adequate recovery time. Do not overuse your joints. If you begin to get sore during workout routines, back off. Slow down or do fewer repetitions. °· For repetitive activities such as cycling or running, maintain your strength and nutrition. °· Alternate muscle groups. For example, if you are a weight lifter, work the upper body on one day and the lower body the next. °· Either tight or weak muscles do not give the proper support for your knee. Tight or weak muscles do not absorb the stress placed on the knee joint. Keep the muscles surrounding the knee strong. °· Take care of mechanical problems. °¨ If you have flat feet, orthotics or special shoes may help. See your caregiver if you need help. °¨ Arch supports, sometimes with wedges on the inner or outer aspect of the heel, can help. These can shift pressure away from the side of the knee most bothered by osteoarthritis. °¨ A brace called an "unloader" brace also may be used to help ease the pressure on the most arthritic side of the knee. °· If your caregiver has prescribed crutches, braces, wraps or ice, use as directed. The acronym for this is PRICE. This means protection, rest, ice, compression, and elevation. °· Nonsteroidal anti-inflammatory drugs (NSAIDs), can help relieve pain. But if taken immediately after an injury, they may actually increase swelling. Take NSAIDs with food in your stomach. Stop them if you develop stomach problems. Do not take these if you have a history of ulcers, stomach pain, or bleeding from the bowel. Do not take without your caregiver's approval if you have problems with fluid retention, heart failure, or kidney problems. °· For ongoing knee problems, physical therapy may be helpful. °· Glucosamine and chondroitin are over-the-counter dietary supplements. Both may help relieve the pain of osteoarthritis in the knee. These medicines are different from  the usual anti-inflammatory drugs. Glucosamine may decrease the rate of cartilage destruction. °· Injections of a corticosteroid drug into your knee joint may help reduce the symptoms of an arthritis flare-up. They may provide pain relief that lasts a few months. You may have to wait a few months between injections. The injections do have a small increased risk of infection, water retention, and elevated blood sugar levels. °· Hyaluronic acid injected into damaged joints may ease pain and provide lubrication. These injections may work by reducing inflammation. A series of shots may give relief for as long as 6 months. °· Topical painkillers. Applying certain ointments to your skin may help relieve the pain and stiffness of osteoarthritis. Ask your pharmacist for suggestions. Many over the-counter products are approved for temporary relief of arthritis pain. °· In some countries, doctors often prescribe topical NSAIDs for relief of chronic conditions such as arthritis and tendinitis.   A review of treatment with NSAID creams found that they worked as well as oral medications but without the serious side effects. °PREVENTION °· Maintain a healthy weight. Extra pounds put more strain on your joints. °· Get strong, stay limber. Weak muscles are a common cause of knee injuries. Stretching is important. Include flexibility exercises in your workouts. °· Be smart about exercise. If you have osteoarthritis, chronic knee pain or recurring injuries, you may need to change the way you exercise. This does not mean you have to stop being active. If your knees ache after jogging or playing basketball, consider switching to swimming, water aerobics, or other low-impact activities, at least for a few days a week. Sometimes limiting high-impact activities will provide relief. °· Make sure your shoes fit well. Choose footwear that is right for your sport. °· Protect your knees. Use the proper gear for knee-sensitive activities. Use  kneepads when playing volleyball or laying carpet. Buckle your seat belt every time you drive. Most shattered kneecaps occur in car accidents. °· Rest when you are tired. °SEEK MEDICAL CARE IF:  °You have knee pain that is continual and does not seem to be getting better.  °SEEK IMMEDIATE MEDICAL CARE IF:  °Your knee joint feels hot to the touch and you have a high fever. °MAKE SURE YOU:  °· Understand these instructions. °· Will watch your condition. °· Will get help right away if you are not doing well or get worse. °Document Released: 04/30/2007 Document Revised: 09/25/2011 Document Reviewed: 04/30/2007 °[employer]® Patient Information ©2015 [employer]. This information is not intended to replace advice given to you by your health care provider. Make sure you discuss any questions you have with your health care provider. ° °

## 2014-03-08 NOTE — ED Provider Notes (Signed)
TIME SEEN: 10:21 AM  CHIEF COMPLAINT: Left knee pain  HPI: Patient is a 80 y.o. F with history of arthritis, hypertension, hyperlipidemia who presents to the emergency department with complaints of left knee pain. She states this is been present for over a year. She states she is here in the emergency department today because "I am sick and tired of this". She states that she has had to have a right knee replacement. She is talked to her orthopedic surgeon about having a left knee replacement but states "he just keeps putting it off". Denies any new injury. No swelling to the leg. No calf tenderness. No redness or erythema. No fever. No chest pain or shortness of breath. No vomiting or diarrhea. She states she has pain medication at home. No new numbness or weakness.  ROS: See HPI Constitutional: no fever  Eyes: no drainage  ENT: no runny nose   Cardiovascular:  no chest pain  Resp: no SOB  GI: no vomiting GU: no dysuria Integumentary: no rash  Allergy: no hives  Musculoskeletal: no leg swelling  Neurological: no slurred speech ROS otherwise negative  PAST MEDICAL HISTORY/PAST SURGICAL HISTORY:  Past Medical History  Diagnosis Date  . Anxiety   . Arthritis   . Cataract     had surgery  . Depression   . GERD (gastroesophageal reflux disease)   . Heart murmur   . Irregular heart beat     doesn't know type  . Hyperlipidemia   . Hypertension     MEDICATIONS:  Prior to Admission medications   Medication Sig Start Date End Date Taking? Authorizing Provider  aspirin 81 MG chewable tablet Chew 81 mg by mouth daily.    Historical Provider, MD  atorvastatin (LIPITOR) 20 MG tablet Take 20 mg by mouth every evening.     Historical Provider, MD  Cholecalciferol 1000 UNITS tablet Take 1,000 Units by mouth daily.    Historical Provider, MD  cyanocobalamin 100 MCG tablet Take 100 mcg by mouth daily.    Historical Provider, MD  diltiazem (CARDIZEM CD) 300 MG 24 hr capsule Take 300 mg by  mouth daily.    Historical Provider, MD  ibuprofen (ADVIL,MOTRIN) 400 MG tablet Take 1 tablet (400 mg total) by mouth every 6 (six) hours as needed for pain. 12/08/12   Julianne Rice, MD  loratadine (CLARITIN) 10 MG tablet Take 1 tablet (10 mg total) by mouth daily. One po daily x 5 days 12/08/12   Julianne Rice, MD  mirtazapine (REMERON) 15 MG tablet Take 15 mg by mouth at bedtime.    Historical Provider, MD  mometasone (NASONEX) 50 MCG/ACT nasal spray Place 2 sprays into the nose daily. 12/08/12   Julianne Rice, MD  Olopatadine HCl 0.2 % SOLN Place 1 drop into both eyes daily.     Historical Provider, MD  omeprazole (PRILOSEC) 20 MG capsule Take 20 mg by mouth daily.     Historical Provider, MD  oxymetazoline (AFRIN NASAL SPRAY) 0.05 % nasal spray Place 2 sprays into the nose 2 (two) times daily. 12/08/12   Julianne Rice, MD  potassium chloride (MICRO-K) 10 MEQ CR capsule Take 10 mEq by mouth 2 (two) times daily.    Historical Provider, MD  traMADol (ULTRAM) 50 MG tablet Take 50 mg by mouth every 6 (six) hours as needed for pain.    Historical Provider, MD  triamterene-hydrochlorothiazide (MAXZIDE) 75-50 MG per tablet Take 1 tablet by mouth every morning.     Historical Provider, MD  ALLERGIES:  Allergies  Allergen Reactions  . Latex Rash  . Penicillins Other (See Comments)    unknown    SOCIAL HISTORY:  History  Substance Use Topics  . Smoking status: Never Smoker   . Smokeless tobacco: Never Used  . Alcohol Use: No    FAMILY HISTORY: Family History  Problem Relation Age of Onset  . Heart disease Father   . Esophageal cancer Sister     EXAM: BP 135/73  Pulse 83  Temp(Src) 98.3 F (36.8 C) (Oral)  Resp 20  SpO2 94% CONSTITUTIONAL: Alert and oriented and responds appropriately to questions. Well-appearing; well-nourished HEAD: Normocephalic EYES: Conjunctivae clear, PERRL ENT: normal nose; no rhinorrhea; moist mucous membranes; pharynx without lesions noted NECK:  Supple, no meningismus, no LAD  CARD: RRR; S1 and S2 appreciated; no murmurs, no clicks, no rubs, no gallops RESP: Normal chest excursion without splinting or tachypnea; breath sounds clear and equal bilaterally; no wheezes, no rhonchi, no rales,  ABD/GI: Normal bowel sounds; non-distended; soft, non-tender, no rebound, no guarding BACK:  The back appears normal and is non-tender to palpation, there is no CVA tenderness EXT: Scar from prior right total knee replacement, there is a small amount of swelling over the left knee but no erythema or warmth, induration or fluctuance, no calf tenderness, no pitting edema, 2+ DP pulses bilaterally, sensation to light touch intact diffusely, no ligamentous laxity, mild amount of tenderness over the left anterior knee, Normal ROM in all joints; otherwise extremities are non-tender to palpation; no edema; normal capillary refill; no cyanosis    SKIN: Normal color for age and race; warm NEURO: Moves all extremities equally, sensation to light touch intact diffusely PSYCH: The patient's mood and manner are appropriate. Grooming and personal hygiene are appropriate.  MEDICAL DECISION MAKING: Patient here with complaints of chronic left knee pain. She states that she would like surgery on her knee as her prior right knee replacement was helpful with her pain. Explained to patient that this is not something that would be done emergently and that she needs to followup with her orthopedic surgeon. There is no sign of infection on exam. No new injury. No neurologic deficit. She has good perfusion of her extremity. Discussed with her that I do not feel she needs any x-rays today as I feel they'll be low yield. She states she has plantar pain medication at home. Have advised her and her daughter to followup with her orthopedic surgeon. They agree with this plan. Discussed supportive care instructions and return precautions. They verbalize understanding.     [place],  DO 03/08/14 1116

## 2014-03-08 NOTE — ED Notes (Signed)
Pt is here with changes on left side of body for the last 2 weeks.  PT states it messes her balance off.  States she woke up this morning and left knee was tight feeling-appears to have some swelling.  No weakness or drifts noted in extremities

## 2014-06-01 ENCOUNTER — Emergency Department (HOSPITAL_COMMUNITY): Payer: Medicare (Managed Care)

## 2014-06-01 ENCOUNTER — Inpatient Hospital Stay (HOSPITAL_COMMUNITY): Payer: Medicare (Managed Care)

## 2014-06-01 ENCOUNTER — Encounter (HOSPITAL_COMMUNITY): Payer: Self-pay | Admitting: Emergency Medicine

## 2014-06-01 ENCOUNTER — Inpatient Hospital Stay (HOSPITAL_COMMUNITY)
Admission: EM | Admit: 2014-06-01 | Discharge: 2014-06-03 | DRG: 066 | Disposition: A | Discharge status: Home / Self Care | Payer: Medicare (Managed Care) | Attending: Internal Medicine | Admitting: Internal Medicine

## 2014-06-01 DIAGNOSIS — R531 Weakness: Secondary | ICD-10-CM

## 2014-06-01 DIAGNOSIS — Z880 Allergy status to penicillin: Secondary | ICD-10-CM

## 2014-06-01 DIAGNOSIS — I639 Cerebral infarction, unspecified: Secondary | ICD-10-CM | POA: Diagnosis present

## 2014-06-01 DIAGNOSIS — Z91040 Latex allergy status: Secondary | ICD-10-CM | POA: Diagnosis not present

## 2014-06-01 DIAGNOSIS — E782 Mixed hyperlipidemia: Secondary | ICD-10-CM | POA: Diagnosis present

## 2014-06-01 DIAGNOSIS — K219 Gastro-esophageal reflux disease without esophagitis: Secondary | ICD-10-CM | POA: Diagnosis present

## 2014-06-01 DIAGNOSIS — R471 Dysarthria and anarthria: Secondary | ICD-10-CM | POA: Diagnosis present

## 2014-06-01 DIAGNOSIS — Z79899 Other long term (current) drug therapy: Secondary | ICD-10-CM

## 2014-06-01 DIAGNOSIS — G629 Polyneuropathy, unspecified: Secondary | ICD-10-CM | POA: Diagnosis present

## 2014-06-01 DIAGNOSIS — M1990 Unspecified osteoarthritis, unspecified site: Secondary | ICD-10-CM | POA: Diagnosis present

## 2014-06-01 DIAGNOSIS — Z7982 Long term (current) use of aspirin: Secondary | ICD-10-CM | POA: Diagnosis not present

## 2014-06-01 DIAGNOSIS — N189 Chronic kidney disease, unspecified: Secondary | ICD-10-CM | POA: Diagnosis present

## 2014-06-01 DIAGNOSIS — I739 Peripheral vascular disease, unspecified: Secondary | ICD-10-CM | POA: Diagnosis present

## 2014-06-01 DIAGNOSIS — Z9842 Cataract extraction status, left eye: Secondary | ICD-10-CM | POA: Diagnosis not present

## 2014-06-01 DIAGNOSIS — G2581 Restless legs syndrome: Secondary | ICD-10-CM | POA: Diagnosis present

## 2014-06-01 DIAGNOSIS — Z9841 Cataract extraction status, right eye: Secondary | ICD-10-CM

## 2014-06-01 DIAGNOSIS — G309 Alzheimer's disease, unspecified: Secondary | ICD-10-CM | POA: Diagnosis present

## 2014-06-01 DIAGNOSIS — Z961 Presence of intraocular lens: Secondary | ICD-10-CM | POA: Diagnosis present

## 2014-06-01 DIAGNOSIS — F0280 Dementia in other diseases classified elsewhere without behavioral disturbance: Secondary | ICD-10-CM | POA: Diagnosis present

## 2014-06-01 DIAGNOSIS — Z96642 Presence of left artificial hip joint: Secondary | ICD-10-CM | POA: Diagnosis present

## 2014-06-01 DIAGNOSIS — I10 Essential (primary) hypertension: Secondary | ICD-10-CM | POA: Diagnosis present

## 2014-06-01 DIAGNOSIS — I129 Hypertensive chronic kidney disease with stage 1 through stage 4 chronic kidney disease, or unspecified chronic kidney disease: Secondary | ICD-10-CM | POA: Diagnosis present

## 2014-06-01 HISTORY — DX: Dementia in other diseases classified elsewhere, unspecified severity, without behavioral disturbance, psychotic disturbance, mood disturbance, and anxiety: F02.80

## 2014-06-01 HISTORY — DX: Alzheimer's disease, unspecified: G30.9

## 2014-06-01 LAB — DIFFERENTIAL
BASOS ABS: 0 10*3/uL (ref 0.0–0.1)
Basophils Relative: 0 % (ref 0–1)
EOS ABS: 0.2 10*3/uL (ref 0.0–0.7)
EOS PCT: 4 % (ref 0–5)
Lymphocytes Relative: 24 % (ref 12–46)
Lymphs Abs: 1.5 10*3/uL (ref 0.7–4.0)
MONO ABS: 0.5 10*3/uL (ref 0.1–1.0)
Monocytes Relative: 9 % (ref 3–12)
Neutro Abs: 3.8 10*3/uL (ref 1.7–7.7)
Neutrophils Relative %: 63 % (ref 43–77)

## 2014-06-01 LAB — COMPREHENSIVE METABOLIC PANEL
ALT: 10 U/L (ref 0–35)
AST: 22 U/L (ref 0–37)
Albumin: 3.7 g/dL (ref 3.5–5.2)
Alkaline Phosphatase: 69 U/L (ref 39–117)
Anion gap: 12 (ref 5–15)
BUN: 23 mg/dL (ref 6–23)
CALCIUM: 9.9 mg/dL (ref 8.4–10.5)
CO2: 24 mEq/L (ref 19–32)
CREATININE: 1.51 mg/dL — AB (ref 0.50–1.10)
Chloride: 104 mEq/L (ref 96–112)
GFR, EST AFRICAN AMERICAN: 36 mL/min — AB (ref >90)
GFR, EST NON AFRICAN AMERICAN: 31 mL/min — AB (ref >90)
Glucose, Bld: 100 mg/dL — ABNORMAL HIGH (ref 70–99)
Potassium: 4.4 mEq/L (ref 3.7–5.3)
SODIUM: 140 meq/L (ref 137–147)
TOTAL PROTEIN: 7.4 g/dL (ref 6.0–8.3)
Total Bilirubin: 0.3 mg/dL (ref 0.3–1.2)

## 2014-06-01 LAB — I-STAT CHEM 8, ED
BUN: 23 mg/dL (ref 6–23)
Calcium, Ion: 1.23 mmol/L (ref 1.13–1.30)
Chloride: 105 mEq/L (ref 96–112)
Creatinine, Ser: 1.6 mg/dL — ABNORMAL HIGH (ref 0.50–1.10)
Glucose, Bld: 107 mg/dL — ABNORMAL HIGH (ref 70–99)
HCT: 42 % (ref 36.0–46.0)
Hemoglobin: 14.3 g/dL (ref 12.0–15.0)
Potassium: 4 mEq/L (ref 3.7–5.3)
Sodium: 141 mEq/L (ref 137–147)
TCO2: 26 mmol/L (ref 0–100)

## 2014-06-01 LAB — CBC
HEMATOCRIT: 37.4 % (ref 36.0–46.0)
Hemoglobin: 13.1 g/dL (ref 12.0–15.0)
MCH: 31.7 pg (ref 26.0–34.0)
MCHC: 35 g/dL (ref 30.0–36.0)
MCV: 90.6 fL (ref 78.0–100.0)
PLATELETS: 114 10*3/uL — AB (ref 150–400)
RBC: 4.13 MIL/uL (ref 3.87–5.11)
RDW: 12.8 % (ref 11.5–15.5)
WBC: 6.1 10*3/uL (ref 4.0–10.5)

## 2014-06-01 LAB — URINALYSIS, ROUTINE W REFLEX MICROSCOPIC
BILIRUBIN URINE: NEGATIVE
Glucose, UA: NEGATIVE mg/dL
HGB URINE DIPSTICK: NEGATIVE
KETONES UR: NEGATIVE mg/dL
Leukocytes, UA: NEGATIVE
NITRITE: NEGATIVE
PH: 7.5 (ref 5.0–8.0)
Protein, ur: NEGATIVE mg/dL
SPECIFIC GRAVITY, URINE: 1.008 (ref 1.005–1.030)
Urobilinogen, UA: 0.2 mg/dL (ref 0.0–1.0)

## 2014-06-01 LAB — PROTIME-INR
INR: 1.05 (ref 0.00–1.49)
PROTHROMBIN TIME: 13.8 s (ref 11.6–15.2)

## 2014-06-01 LAB — I-STAT TROPONIN, ED: TROPONIN I, POC: 0.01 ng/mL (ref 0.00–0.08)

## 2014-06-01 LAB — RAPID URINE DRUG SCREEN, HOSP PERFORMED
Amphetamines: NOT DETECTED
BARBITURATES: NOT DETECTED
BENZODIAZEPINES: NOT DETECTED
COCAINE: NOT DETECTED
OPIATES: NOT DETECTED
TETRAHYDROCANNABINOL: NOT DETECTED

## 2014-06-01 LAB — ETHANOL: Alcohol, Ethyl (B): <11 mg/dL (ref 0–11)

## 2014-06-01 LAB — APTT: aPTT: 29 seconds (ref 24–37)

## 2014-06-01 MED ORDER — STROKE: EARLY STAGES OF RECOVERY BOOK
Freq: Once | Status: AC
  Administered 2014-06-01: 15:00:00
  Filled 2014-06-01: qty 1

## 2014-06-01 MED ORDER — ENOXAPARIN SODIUM 40 MG/0.4ML ~~LOC~~ SOLN
40.0000 mg | SUBCUTANEOUS | Status: DC
  Administered 2014-06-01 – 2014-06-02 (×2): 40 mg via SUBCUTANEOUS
  Filled 2014-06-01 (×2): qty 0.4

## 2014-06-01 MED ORDER — ASPIRIN 300 MG RE SUPP: 300.0000 mg | Freq: Every day | RECTAL | Status: DC

## 2014-06-01 MED ORDER — SENNOSIDES-DOCUSATE SODIUM 8.6-50 MG PO TABS: 1.0000 | ORAL_TABLET | Freq: Every evening | ORAL | Status: DC | PRN

## 2014-06-01 MED ORDER — ASPIRIN 325 MG PO TABS
325.0000 mg | ORAL_TABLET | Freq: Every day | ORAL | Status: DC
  Administered 2014-06-01 – 2014-06-03 (×3): 325 mg via ORAL
  Filled 2014-06-01 (×3): qty 1

## 2014-06-01 NOTE — Consult Note (Signed)
Referring Physician: Jeneen Rinks    Chief Complaint: Stroke code  HPI:                                                                                                                                         Brenda Werner is an 80 y.o. female Who was taking part in rehabilitation at [facility] when staff noted a acute onset of dysarthria. In talking with patient , she noted she was having difficulty with her speech dating back to Saturday 05/30/2014.  Patient was brought to ED as a Code stroke.  On arrival she did show significant dysarthria. She had some complaints of decreased sensation on the left buttock to leg but stated that occurred one months. Ago. Patient was brought back to CT and initial head CT was negative.  Patient was not a tPA candidate due to being out of window.    Date last known well: Date: 05/30/2014 Time last known well: Unable to determine tPA Given: No: out of window  Past Medical History  Diagnosis Date  . Anxiety   . Arthritis   . Cataract     had surgery  . Depression   . GERD (gastroesophageal reflux disease)   . Heart murmur   . Irregular heart beat     doesn't know type  . Hyperlipidemia   . Hypertension     Past Surgical History  Procedure Laterality Date  . Shoulder arthroscopy      twice on right  . Hip surgery      replacement, left  . Replacement total knee      right  . Foot fracture surgery      at ankle, left  . Joint replacement    . Fracture surgery    . Eye surgery    . Cataract extraction w/ intraocular lens  implant, bilateral  2013  . Total hip revision  07/08/2012    Procedure: TOTAL HIP REVISION;  Surgeon: Jessy Oto, MD;  Location: [facility];  Service: Orthopedics;  Laterality: Left;  Revision of left hip ASR to Gryption acetabulum with poly and ceramic femoral head, removal of ASR head and acetabular shell    Family History  Problem Relation Age of Onset  . Heart disease Father   . Esophageal cancer Sister    Social History:   reports that she has never smoked. She has never used smokeless tobacco. She reports that she does not drink alcohol or use illicit drugs.  Allergies:  Allergies  Allergen Reactions  . Latex Rash  . Penicillins Other (See Comments)    unknown    Medications:  Current Facility-Administered Medications  Medication Dose Route Frequency Provider Last Rate Last Dose  . 0.9 %  sodium chloride infusion  500 mL Intravenous Continuous Milus Banister, MD       Current Outpatient Prescriptions  Medication Sig Dispense Refill  . aspirin 81 MG chewable tablet Chew 81 mg by mouth daily.    Marland Kitchen atorvastatin (LIPITOR) 20 MG tablet Take 20 mg by mouth every evening.     . Cholecalciferol 1000 UNITS tablet Take 1,000 Units by mouth daily.    . cyanocobalamin 100 MCG tablet Take 100 mcg by mouth daily.    Marland Kitchen diltiazem (CARDIZEM CD) 300 MG 24 hr capsule Take 300 mg by mouth daily.    Marland Kitchen ibuprofen (ADVIL,MOTRIN) 400 MG tablet Take 1 tablet (400 mg total) by mouth every 6 (six) hours as needed for pain. 30 tablet 0  . loratadine (CLARITIN) 10 MG tablet Take 1 tablet (10 mg total) by mouth daily. One po daily x 5 days 5 tablet 0  . mirtazapine (REMERON) 15 MG tablet Take 15 mg by mouth at bedtime.    . mometasone (NASONEX) 50 MCG/ACT nasal spray Place 2 sprays into the nose daily. 17 g 12  . Olopatadine HCl 0.2 % SOLN Place 1 drop into both eyes daily.     Marland Kitchen omeprazole (PRILOSEC) 20 MG capsule Take 20 mg by mouth daily.     Marland Kitchen oxymetazoline (AFRIN NASAL SPRAY) 0.05 % nasal spray Place 2 sprays into the nose 2 (two) times daily. 30 mL 0  . potassium chloride (MICRO-K) 10 MEQ CR capsule Take 10 mEq by mouth 2 (two) times daily.    . traMADol (ULTRAM) 50 MG tablet Take 50 mg by mouth every 6 (six) hours as needed for pain.    Marland Kitchen triamterene-hydrochlorothiazide (MAXZIDE) 75-50 MG per tablet Take 1  tablet by mouth every morning.        ROS:                                                                                                                                       History obtained from the patient  General ROS: negative for - chills, fatigue, fever, night sweats, weight gain or weight loss Psychological ROS: negative for - behavioral disorder, hallucinations, memory difficulties, mood swings or suicidal ideation Ophthalmic ROS: negative for - blurry vision, double vision, eye pain or loss of vision ENT ROS: negative for - epistaxis, nasal discharge, oral lesions, sore throat, tinnitus or vertigo Allergy and Immunology ROS: negative for - hives or itchy/watery eyes Hematological and Lymphatic ROS: negative for - bleeding problems, bruising or swollen lymph nodes Endocrine ROS: negative for - galactorrhea, hair pattern changes, polydipsia/polyuria or temperature intolerance Respiratory ROS: negative for - cough, hemoptysis, shortness of breath or wheezing Cardiovascular ROS: negative for - chest pain, dyspnea on exertion, edema or irregular heartbeat Gastrointestinal ROS: negative for - abdominal pain,  diarrhea, hematemesis, nausea/vomiting or stool incontinence Genito-Urinary ROS: negative for - dysuria, hematuria, incontinence or urinary frequency/urgency Musculoskeletal ROS: negative for - joint swelling or muscular weakness Neurological ROS: as noted in HPI Dermatological ROS: negative for rash and skin lesion changes  Neurologic Examination:                                                                                                      There were no vitals taken for this visit.   General: NAD Mental Status: Alert, oriented, thought content appropriate.  Speech dysarthric without evidence of aphasia.  Able to follow 3 step commands without difficulty. Cranial Nerves: II: Discs flat bilaterally; Visual fields grossly normal, pupils equal, round, reactive to light and  accommodation III,IV, VI: ptosis not present, extra-ocular motions intact bilaterally V,VII: right facial droop, facial light touch sensation normal bilaterally VIII: hearing normal bilaterally IX,X: gag reflex present XI: bilateral shoulder shrug XII: mild right deviation of tongue   Motor: Right : Upper extremity   5/5    Left:     Upper extremity   5/5  Lower extremity   5/5     Lower extremity   5/5 Tone and bulk:normal tone throughout; no atrophy noted Sensory: Pinprick and light touch intact throughout, bilaterally Deep Tendon Reflexes:  Right: Upper Extremity   Left: Upper extremity   biceps (C-5 to C-6) 1/4   biceps (C-5 to C-6) 1/4 tricep (C7) 1/4    triceps (C7) 1/4 Brachioradialis (C6) 1/4  Brachioradialis (C6) 1/4  Lower Extremity Lower Extremity  quadriceps (L-2 to L-4) 0/4   quadriceps (L-2 to L-4) 0/4 Achilles (S1) 0/4   Achilles (S1) 0/4  Plantars: Right: downgoing   Left: downgoing Cerebellar: normal finger-to-nose,  normal heel-to-shin test Gait: not tested due to multiple leads CV: pulses palpable throughout    Lab Results: Basic Metabolic Panel:  Recent Labs Lab 06/01/14 1129  NA 141  K 4.0  CL 105  GLUCOSE 107*  BUN 23  CREATININE 1.60*    Liver Function Tests: No results for input(s): AST, ALT, ALKPHOS, BILITOT, PROT, ALBUMIN in the last 168 hours. No results for input(s): LIPASE, AMYLASE in the last 168 hours. No results for input(s): AMMONIA in the last 168 hours.  CBC:  Recent Labs Lab 06/01/14 1129  HGB 14.3  HCT 42.0    Cardiac Enzymes: No results for input(s): CKTOTAL, CKMB, CKMBINDEX, TROPONINI in the last 168 hours.  Lipid Panel: No results for input(s): CHOL, TRIG, HDL, CHOLHDL, VLDL, LDLCALC in the last 168 hours.  CBG: No results for input(s): GLUCAP in the last 168 hours.  Microbiology: Results for orders placed or performed during the hospital encounter of 07/08/12  Body fluid culture     Status: None    Collection Time: 07/08/12  8:31 AM  Result Value Ref Range Status   Specimen Description FLUID SYNOVIAL LEFT HIP  Final   Special Requests PT ON VANCO  Final   Gram Stain   Final    NO WBC SEEN NO ORGANISMS SEEN Performed at [facility]  Culture NO GROWTH 3 DAYS  Final   Report Status 07/11/2012 FINAL  Final  Gram stain     Status: None   Collection Time: 07/08/12  8:31 AM  Result Value Ref Range Status   Specimen Description FLUID SYNOVIAL LEFT HIP  Final   Special Requests IN [place] PT ON VANCO  Final   Gram Stain RARE WBC SEEN NO ORGANISMS SEEN  Final   Report Status 07/08/2012 FINAL  Final    Coagulation Studies: No results for input(s): LABPROT, INR in the last 72 hours.  Imaging: No results found.     Assessment and plan discussed with with attending physician and they are in agreement.    Etta Quill PA-C [facility] [phone]  06/01/2014, 11:33 AM   Assessment: 80 y.o. female with a history of dysarthria. Per [facility], she was not clearly slurring her words earlier, but worsened while she was there. The patient, however has noticed symptoms since Saturday but in any case her symtpoms are mild.   Stroke Risk Factors - hyperlipidemia and hypertension  1. HgbA1c, fasting lipid panel 2. MRI, MRA  of the brain without contrast 3. Frequent neuro checks 4. Echocardiogram 5. Carotid dopplers 6. Prophylactic therapy-Antiplatelet med: Aspirin - dose 325mg  PO or 300mg  PR 7. Risk factor modification 8. Telemetry monitoring 9. PT consult, OT consult, Speech consult  Roland Rack, MD [facility] [phone]  If 7pm- 7am, please page neurology on call as listed in [place].

## 2014-06-01 NOTE — Code Documentation (Signed)
80yo female arriving to [facility] via [place] at 76.  EMS reports that the patient was at the [facility] program when she was noticed to have slurred speech and facial droop.  EMS called and activated a Code Stroke.  Patient taken to CT on arrival.  Stroke team at the bedside.  NIHSS 2, see documentation for details and times.  Patient with dysarthria and right facial droop.  Patient reports that she had trouble speaking on Saturday.  Stroke RN contacted staff at [facility] who reports that she worked with PT from [place] to 1030 and no speech deficits were noticed.  From there patient went to the day center where the activity coordinator noticed the patient to have garbled speech and facial droop.  Patient told staff at [facility] that she had trouble with her speech on Saturday as well.  Patient also reports that she has been having left leg and foot numbness for several months.  Patient is outside the window for treatment with tPA.  No acute stroke treatment at this time per Dr. Leonel Ramsay.

## 2014-06-01 NOTE — ED Notes (Signed)
Attempted to call report

## 2014-06-01 NOTE — H&P (Addendum)
[facility] History and Physical  Brenda Werner:[id number] DOB: 15-Nov-1929 DOA: 06/01/2014  Referring physician: er PCP: Sherian Maroon, MD   Chief Complaint: difficulty speaking  HPI: Brenda Werner is a 80 y.o. female  Who lives with her daughter.  [facility].  After PT today, they noticed that patient had dysarthria.  Patient states she also had trouble taking on Saturday.  Patient is relatively healthy- no recent hospitalizations.    No recent illness, no CP, no SOB.  No fevers, no chills.   In the ER, a CT scan was done and patient was brought in as a code stoke- no TPA as unknown time of onset.  Seen by neuro who wants a CVA work up by [facility].  Await urine but labs appear to be at baseline   Review of Systems:  All systems reviewed, negative unless stated above   Past Medical History  Diagnosis Date  . Anxiety   . Arthritis   . Cataract     had surgery  . Depression   . GERD (gastroesophageal reflux disease)   . Heart murmur   . Irregular heart beat     doesn't know type  . Hyperlipidemia   . Hypertension   . Alzheimer's dementia    Past Surgical History  Procedure Laterality Date  . Shoulder arthroscopy      twice on right  . Hip surgery      replacement, left  . Replacement total knee      right  . Foot fracture surgery      at ankle, left  . Joint replacement    . Fracture surgery    . Eye surgery    . Cataract extraction w/ intraocular lens  implant, bilateral  2013  . Total hip revision  07/08/2012    Procedure: TOTAL HIP REVISION;  Surgeon: Jessy Oto, MD;  Location: [facility];  Service: Orthopedics;  Laterality: Left;  Revision of left hip ASR to Gryption acetabulum with poly and ceramic femoral head, removal of ASR head and acetabular shell   Social History:  reports that she has never smoked. She has never used smokeless tobacco. She reports that she does not drink alcohol or use illicit  drugs.  Allergies  Allergen Reactions  . Latex Rash  . Penicillins Other (See Comments)    unknown    Family History  Problem Relation Age of Onset  . Heart disease Father   . Esophageal cancer Sister      Prior to Admission medications   Medication Sig Start Date End Date Taking? Authorizing Provider  aspirin 81 MG chewable tablet Chew 81 mg by mouth daily.    Historical Provider, MD  atorvastatin (LIPITOR) 20 MG tablet Take 20 mg by mouth every evening.     Historical Provider, MD  Cholecalciferol 1000 UNITS tablet Take 1,000 Units by mouth daily.    Historical Provider, MD  cyanocobalamin 100 MCG tablet Take 100 mcg by mouth daily.    Historical Provider, MD  diltiazem (CARDIZEM CD) 300 MG 24 hr capsule Take 300 mg by mouth daily.    Historical Provider, MD  ibuprofen (ADVIL,MOTRIN) 400 MG tablet Take 1 tablet (400 mg total) by mouth every 6 (six) hours as needed for pain. 12/08/12   Julianne Rice, MD  loratadine (CLARITIN) 10 MG tablet Take 1 tablet (10 mg total) by mouth daily. One po daily x 5 days 12/08/12   Julianne Rice, MD  mirtazapine (  REMERON) 15 MG tablet Take 15 mg by mouth at bedtime.    Historical Provider, MD  mometasone (NASONEX) 50 MCG/ACT nasal spray Place 2 sprays into the nose daily. 12/08/12   Julianne Rice, MD  Olopatadine HCl 0.2 % SOLN Place 1 drop into both eyes daily.     Historical Provider, MD  omeprazole (PRILOSEC) 20 MG capsule Take 20 mg by mouth daily.     Historical Provider, MD  oxymetazoline (AFRIN NASAL SPRAY) 0.05 % nasal spray Place 2 sprays into the nose 2 (two) times daily. 12/08/12   Julianne Rice, MD  potassium chloride (MICRO-K) 10 MEQ CR capsule Take 10 mEq by mouth 2 (two) times daily.    Historical Provider, MD  traMADol (ULTRAM) 50 MG tablet Take 50 mg by mouth every 6 (six) hours as needed for pain.    Historical Provider, MD  triamterene-hydrochlorothiazide (MAXZIDE) 75-50 MG per tablet Take 1 tablet by mouth every morning.      Historical Provider, MD   Physical Exam: Filed Vitals:   06/01/14 1200 06/01/14 1200 06/01/14 1215 06/01/14 1230  BP: 166/76  157/85 146/76  Pulse: 67  70 66  Temp:  97.7 F (36.5 C)    TempSrc:      Resp: 16  22 13   SpO2: 96%  97% 97%    Wt Readings from Last 3 Encounters:  07/09/12 65.8 kg (145 lb 1 oz)  07/04/12 65.8 kg (145 lb 1 oz)  11/16/11 71.668 kg (158 lb)    General:  Appears calm and comfortable- some speech difficulty but does make since Eyes: PERRL, normal lids, irises & conjunctiva ENT: grossly normal hearing, lips & tongue Neck: no LAD, masses or thyromegaly Cardiovascular: RRR, no m/r/g. No LE edema. Respiratory: CTA bilaterally, no w/r/r. Normal respiratory effort. Abdomen: soft, ntnd Skin: no rash or induration seen on limited exam Musculoskeletal: grossly normal tone BUE/BLE Psychiatric: grossly normal mood and affect, speech fluent and appropriate Neurologic: grossly non-focal.          Labs on Admission:  Basic Metabolic Panel:  Recent Labs Lab 06/01/14 1110 06/01/14 1129  NA 140 141  K 4.4 4.0  CL 104 105  CO2 24  --   GLUCOSE 100* 107*  BUN 23 23  CREATININE 1.51* 1.60*  CALCIUM 9.9  --    Liver Function Tests:  Recent Labs Lab 06/01/14 1110  AST 22  ALT 10  ALKPHOS 69  BILITOT 0.3  PROT 7.4  ALBUMIN 3.7   No results for input(s): LIPASE, AMYLASE in the last 168 hours. No results for input(s): AMMONIA in the last 168 hours. CBC:  Recent Labs Lab 06/01/14 1110 06/01/14 1129  WBC 6.1  --   NEUTROABS 3.8  --   HGB 13.1 14.3  HCT 37.4 42.0  MCV 90.6  --   PLT PENDING  --    Cardiac Enzymes: No results for input(s): CKTOTAL, CKMB, CKMBINDEX, TROPONINI in the last 168 hours.  BNP (last 3 results) No results for input(s): PROBNP in the last 8760 hours. CBG: No results for input(s): GLUCAP in the last 168 hours.  Radiological Exams on Admission: Ct Head Wo Contrast  06/01/2014   CLINICAL DATA:  Code stroke. Slurred  speech. Left-sided weakness for 1 month  EXAM: CT HEAD WITHOUT CONTRAST  TECHNIQUE: Contiguous axial images were obtained from the base of the skull through the vertex without intravenous contrast.  COMPARISON:  12/08/2012.  FINDINGS: There is patchy low attenuation within the periventricular and  subcortical white matter. Chronica right basal ganglia lacunar infarct noted. Prominence of the sulci and ventricles noted.There is no evidence for acute brain infarct, hemorrhage or mass. No extra-axial fluid collection identified. There is partial opacification of the mastoid air cells and the left side of the frontal sinus. Bilateral median antrectomies have been performed. The calvarium appears intact.  IMPRESSION: 1. No acute intracranial abnormalities. 2. Chronic small vessel ischemic disease and brain atrophy.   Electronically Signed   By: Kerby Moors M.D.   On: 06/01/2014 11:37    EKG: Independently reviewed. sinus  Assessment/Plan Active Problems:   CVA (cerebral infarction)   CVA- MRI, carotid, echo, FLP, Hgba1C ASA per neurology Speech eval PT/OT  HTN- permissive HTN  HLD- FLP  CKD- baseline 1.8-1.9  neurology  Code Status:full DVT Prophylaxis: Family Communication: updated daughter Roselyn Disposition Plan: admit  Time spent: 65 min  Eulogio Bear [facility] Pager [phone]

## 2014-06-01 NOTE — ED Notes (Signed)
Hospitalist at bedside 

## 2014-06-01 NOTE — Progress Notes (Signed)
Pt arrived to the unit.  Welcomed and settled in.  Telemetry applied call bell within reach.  Family present.  Will continue to monitor. Cori Razor, RN

## 2014-06-01 NOTE — ED Provider Notes (Signed)
CSN: [id number]     Arrival date & time 06/01/14  1106 History   First MD Initiated Contact with Patient 06/01/14 1112   Patient seen upon arrival at the pod B bridge by myself at 11:12.  Cleared to go to CT scan.    PCP is Dr. Barney Drain at [facility].  Patient presents for evaluation of sudden onset of difficulty speaking at 10 AM. This was her last time known normal. Her only complaint is that is difficult to form words. She denies headache. She denies difficulty swallowing. No numbness weakness or tremors. No vertigo symptoms.  History of stroke. No recent palpitations or TIA symptoms.  Chief Complaint  Patient presents with  . Code Stroke     HPI  Dr. Barney Drain at [place] is PCP.    Past Medical History  Diagnosis Date  . Anxiety   . Arthritis   . Cataract     had surgery  . Depression   . GERD (gastroesophageal reflux disease)   . Heart murmur   . Irregular heart beat     doesn't know type  . Hyperlipidemia   . Hypertension   . Alzheimer's dementia    Past Surgical History  Procedure Laterality Date  . Shoulder arthroscopy      twice on right  . Hip surgery      replacement, left  . Replacement total knee      right  . Foot fracture surgery      at ankle, left  . Joint replacement    . Fracture surgery    . Eye surgery    . Cataract extraction w/ intraocular lens  implant, bilateral  2013  . Total hip revision  07/08/2012    Procedure: TOTAL HIP REVISION;  Surgeon: Jessy Oto, MD;  Location: [facility];  Service: Orthopedics;  Laterality: Left;  Revision of left hip ASR to Gryption acetabulum with poly and ceramic femoral head, removal of ASR head and acetabular shell   Family History  Problem Relation Age of Onset  . Heart disease Father   . Esophageal cancer Sister    History  Substance Use Topics  . Smoking status: Never Smoker   . Smokeless tobacco: Never Used  . Alcohol Use: No   OB History    No data available     Review  of Systems  Constitutional: Negative for fever, chills, diaphoresis, appetite change and fatigue.  HENT: Negative for mouth sores, sore throat and trouble swallowing.   Eyes: Negative for visual disturbance.  Respiratory: Negative for cough, chest tightness, shortness of breath and wheezing.   Cardiovascular: Negative for chest pain.  Gastrointestinal: Negative for nausea, vomiting, abdominal pain, diarrhea and abdominal distention.  Endocrine: Negative for polydipsia, polyphagia and polyuria.  Genitourinary: Negative for dysuria, frequency and hematuria.  Musculoskeletal: Negative for gait problem.  Skin: Negative for color change, pallor and rash.  Neurological: Positive for speech difficulty. Negative for dizziness, syncope, light-headedness and headaches.  Hematological: Does not bruise/bleed easily.  Psychiatric/Behavioral: Negative for behavioral problems and confusion.      Allergies  Latex and Penicillins  Home Medications   Prior to Admission medications   Medication Sig Start Date End Date Taking? Authorizing Provider  aspirin 81 MG chewable tablet Chew 81 mg by mouth daily.    Historical Provider, MD  atorvastatin (LIPITOR) 20 MG tablet Take 20 mg by mouth every evening.     Historical Provider, MD  Cholecalciferol 1000 UNITS tablet Take  1,000 Units by mouth daily.    Historical Provider, MD  cyanocobalamin 100 MCG tablet Take 100 mcg by mouth daily.    Historical Provider, MD  diltiazem (CARDIZEM CD) 300 MG 24 hr capsule Take 300 mg by mouth daily.    Historical Provider, MD  ibuprofen (ADVIL,MOTRIN) 400 MG tablet Take 1 tablet (400 mg total) by mouth every 6 (six) hours as needed for pain. 12/08/12   Julianne Rice, MD  loratadine (CLARITIN) 10 MG tablet Take 1 tablet (10 mg total) by mouth daily. One po daily x 5 days 12/08/12   Julianne Rice, MD  mirtazapine (REMERON) 15 MG tablet Take 15 mg by mouth at bedtime.    Historical Provider, MD  mometasone (NASONEX) 50  MCG/ACT nasal spray Place 2 sprays into the nose daily. 12/08/12   Julianne Rice, MD  Olopatadine HCl 0.2 % SOLN Place 1 drop into both eyes daily.     Historical Provider, MD  omeprazole (PRILOSEC) 20 MG capsule Take 20 mg by mouth daily.     Historical Provider, MD  oxymetazoline (AFRIN NASAL SPRAY) 0.05 % nasal spray Place 2 sprays into the nose 2 (two) times daily. 12/08/12   Julianne Rice, MD  potassium chloride (MICRO-K) 10 MEQ CR capsule Take 10 mEq by mouth 2 (two) times daily.    Historical Provider, MD  traMADol (ULTRAM) 50 MG tablet Take 50 mg by mouth every 6 (six) hours as needed for pain.    Historical Provider, MD  triamterene-hydrochlorothiazide (MAXZIDE) 75-50 MG per tablet Take 1 tablet by mouth every morning.     Historical Provider, MD   BP 166/73 mmHg  Temp(Src) 97.7 F (36.5 C) (Oral)  Resp 18  SpO2 98% Physical Exam  Constitutional: She is oriented to person, place, and time. She appears well-developed and well-nourished. No distress.  HENT:  Head: Normocephalic.  Eyes: Conjunctivae are normal. Pupils are equal, round, and reactive to light. No scleral icterus.  Neck: Normal range of motion. Neck supple. No thyromegaly present.  Cardiovascular: Normal rate and regular rhythm.  Exam reveals no gallop and no friction rub.   No murmur heard. Pulmonary/Chest: Effort normal and breath sounds normal. No respiratory distress. She has no wheezes. She has no rales.  Abdominal: Soft. Bowel sounds are normal. She exhibits no distension. There is no tenderness. There is no rebound.  Musculoskeletal: Normal range of motion.  Neurological: She is alert and oriented to person, place, and time.  No facial droop. Marked dysarthric speech. Normal symmetric palate without paresis. Normal tongue protrusion. No additional cranial nerve deficits. Normal peripheral strength and sensation testing. Gait not tested.   Skin: Skin is warm and dry. No rash noted.  Psychiatric: She has a normal  mood and affect. Her behavior is normal.    ED Course  Procedures (including critical care time) Labs Review Labs Reviewed  I-STAT CHEM 8, ED - Abnormal; Notable for the following:    Creatinine, Ser 1.60 (*)    Glucose, Bld 107 (*)    All other components within normal limits  CBC  DIFFERENTIAL  ETHANOL  PROTIME-INR  APTT  COMPREHENSIVE METABOLIC PANEL  URINE RAPID DRUG SCREEN (HOSP PERFORMED)  URINALYSIS, ROUTINE W REFLEX MICROSCOPIC  I-STAT TROPOININ, ED  I-STAT TROPOININ, ED    Imaging Review Ct Head Wo Contrast  06/01/2014   CLINICAL DATA:  Code stroke. Slurred speech. Left-sided weakness for 1 month  EXAM: CT HEAD WITHOUT CONTRAST  TECHNIQUE: Contiguous axial images were obtained from the  base of the skull through the vertex without intravenous contrast.  COMPARISON:  12/08/2012.  FINDINGS: There is patchy low attenuation within the periventricular and subcortical white matter. Chronica right basal ganglia lacunar infarct noted. Prominence of the sulci and ventricles noted.There is no evidence for acute brain infarct, hemorrhage or mass. No extra-axial fluid collection identified. There is partial opacification of the mastoid air cells and the left side of the frontal sinus. Bilateral median antrectomies have been performed. The calvarium appears intact.  IMPRESSION: 1. No acute intracranial abnormalities. 2. Chronic small vessel ischemic disease and brain atrophy.   Electronically Signed   By: Kerby Moors M.D.   On: 06/01/2014 11:37     EKG Interpretation   Date/Time:  Monday June 01 2014 11:30:22 EST Ventricular Rate:  67 PR Interval:  177 QRS Duration: 90 QT Interval:  397 QTC Calculation: 419 R Axis:   -20 Text Interpretation:  Sinus rhythm Borderline left axis deviation Low  voltage, extremity leads Abnormal R-wave progression, early transition  Baseline wander in lead(s) I II aVR V1 V2 V3 V4 V5 V6 Confirmed by Jeneen Rinks   MD, [place] ([id number]) on 06/01/2014  11:32:50 AM      MDM   Final diagnoses:  Cerebral infarction due to unspecified mechanism    CT scan without acute findings. She has dysarthria. An isolated finding. Not a candidate for acute interventional lytic therapy.  D/W Dr. Leonel Ramsay of Neurology. Also with [facility].  To be admitted    Tanna Furry, MD 06/01/14 1225

## 2014-06-02 DIAGNOSIS — I639 Cerebral infarction, unspecified: Secondary | ICD-10-CM | POA: Diagnosis not present

## 2014-06-02 DIAGNOSIS — I519 Heart disease, unspecified: Secondary | ICD-10-CM

## 2014-06-02 LAB — LIPID PANEL
Cholesterol: 155 mg/dL (ref 0–200)
HDL: 66 mg/dL (ref >39)
LDL Cholesterol: 71 mg/dL (ref 0–99)
TRIGLYCERIDES: 91 mg/dL (ref <150)
Total CHOL/HDL Ratio: 2.3 RATIO
VLDL: 18 mg/dL (ref 0–40)

## 2014-06-02 LAB — HEMOGLOBIN A1C
Hgb A1c MFr Bld: 5.1 % (ref <5.7)
MEAN PLASMA GLUCOSE: 100 mg/dL (ref <117)

## 2014-06-02 MED ORDER — GABAPENTIN 300 MG PO CAPS
300.0000 mg | ORAL_CAPSULE | Freq: Every day | ORAL | Status: DC
Start: 2014-06-02 — End: 2014-06-03
  Administered 2014-06-02: 300 mg via ORAL
  Filled 2014-06-02: qty 1

## 2014-06-02 MED ORDER — CARVEDILOL 12.5 MG PO TABS
25.0000 mg | ORAL_TABLET | Freq: Two times a day (BID) | ORAL | Status: DC
  Administered 2014-06-02 – 2014-06-03 (×3): 25 mg via ORAL
  Filled 2014-06-02 (×3): qty 2

## 2014-06-02 MED ORDER — TRAMADOL HCL 50 MG PO TABS: 50.0000 mg | ORAL_TABLET | Freq: Four times a day (QID) | ORAL | Status: DC | PRN

## 2014-06-02 MED ORDER — HYDROCODONE-ACETAMINOPHEN 5-325 MG PO TABS: 1.0000 | ORAL_TABLET | Freq: Four times a day (QID) | ORAL | Status: DC | PRN

## 2014-06-02 MED ORDER — VALSARTAN-HYDROCHLOROTHIAZIDE 160-12.5 MG PO TABS: 1.0000 | ORAL_TABLET | Freq: Every day | ORAL | Status: DC

## 2014-06-02 MED ORDER — MIRTAZAPINE 15 MG PO TABS
15.0000 mg | ORAL_TABLET | Freq: Every day | ORAL | Status: DC
  Administered 2014-06-02: 15 mg via ORAL
  Filled 2014-06-02 (×2): qty 1

## 2014-06-02 MED ORDER — ATORVASTATIN CALCIUM 10 MG PO TABS
20.0000 mg | ORAL_TABLET | Freq: Every evening | ORAL | Status: DC
  Administered 2014-06-02: 20 mg via ORAL
  Filled 2014-06-02: qty 2

## 2014-06-02 MED ORDER — ROPINIROLE HCL 1 MG PO TABS
0.5000 mg | ORAL_TABLET | Freq: Every day | ORAL | Status: DC
  Administered 2014-06-02: 0.5 mg via ORAL
  Filled 2014-06-02: qty 0.5

## 2014-06-02 MED ORDER — BUPROPION HCL ER (XL) 150 MG PO TB24
300.0000 mg | ORAL_TABLET | Freq: Every day | ORAL | Status: DC
  Administered 2014-06-02 – 2014-06-03 (×2): 300 mg via ORAL
  Filled 2014-06-02 (×2): qty 2

## 2014-06-02 MED ORDER — AMLODIPINE BESYLATE 10 MG PO TABS
10.0000 mg | ORAL_TABLET | Freq: Every day | ORAL | Status: DC
  Administered 2014-06-02 – 2014-06-03 (×2): 10 mg via ORAL
  Filled 2014-06-02 (×2): qty 1

## 2014-06-02 MED ORDER — DOCUSATE SODIUM 100 MG PO CAPS
100.0000 mg | ORAL_CAPSULE | Freq: Two times a day (BID) | ORAL | Status: DC
  Administered 2014-06-02 – 2014-06-03 (×3): 100 mg via ORAL
  Filled 2014-06-02 (×3): qty 1

## 2014-06-02 MED ORDER — HYDROCODONE-ACETAMINOPHEN 5-325 MG PO TABS
2.0000 | ORAL_TABLET | Freq: Once | ORAL | Status: AC
  Administered 2014-06-02: 2 via ORAL
  Filled 2014-06-02: qty 2

## 2014-06-02 MED ORDER — GABAPENTIN 100 MG PO CAPS: 200.0000 mg | ORAL_CAPSULE | Freq: Every day | ORAL | Status: DC

## 2014-06-02 NOTE — Progress Notes (Signed)
Patient says SCD is causing her left leg to hurt I removed it around 2200 11/16, patient woke up at 0320 complaining of severe left knee to mid shin pain, I paged [facility] to see if we could give her something stronger than aspirin. Will continue to monitor.

## 2014-06-02 NOTE — Progress Notes (Signed)
Nutrition Brief Note  Patient identified on the Malnutrition Screening Tool (MST) Report  Wt Readings from Last 15 Encounters:  06/02/14 150 lb (68.04 kg)  07/09/12 145 lb 1 oz (65.8 kg)  07/04/12 145 lb 1 oz (65.8 kg)  11/16/11 158 lb (71.668 kg)  11/07/11 156 lb (70.761 kg)  11/02/11 157 lb (71.215 kg)  11/01/11 156 lb 6 oz (70.931 kg)  11/21/10 145 lb (65.772 kg)  11/07/10 147 lb (66.679 kg)    Body mass index is 26.09 kg/(m^2). Patient meets criteria for Overweight based on current BMI.   Current diet order is Heart Healthy, patient is consuming approximately 50-90% of meals at this time. Pt denies and recent unintended weight loss and reports having a good appetite. She states she is eating well/normally for her. Labs and medications reviewed.   No nutrition interventions warranted at this time. If nutrition issues arise, please consult RD.   Pryor Ochoa RD, LDN Inpatient Clinical Dietitian Pager: [phone] After Hours Pager: [phone]

## 2014-06-02 NOTE — Plan of Care (Signed)
Problem: Acute Treatment Outcomes Goal: Airway maintained/protected Outcome: Completed/Met Date Met:  06/02/14     

## 2014-06-02 NOTE — Progress Notes (Signed)
PROGRESS NOTE  Brenda Werner HBZ:[id number] DOB: 1930-02-04 DOA: 06/01/2014 PCP: Sherian Maroon, MD  Assessment/Plan: CVA- MRI: Small, acute left corona radiata infarct -carotid, echo - FLP (see below), Hgba1C ASA per neurology Speech eval PT/OT  HTN- permissive HTN- will resume some meds  HLD- FLP: LDL 71, HDL 66- on statin  CKD- baseline 1.8-1.9  Neuropathy -resume Neurontin   Code Status: full Family Communication: patient Disposition Plan:    Consultants:  neurology  Procedures:      HPI/Subjective: No SOB, no CP  Objective: Filed Vitals:   06/02/14 0606  BP: 110/68  Pulse: 76  Temp: 97.9 F (36.6 C)  Resp: 18   No intake or output data in the 24 hours ending 06/02/14 0803 There were no vitals filed for this visit.  Exam:   General:  A+Ox3, NAD  Cardiovascular: rrr  Respiratory: clear  Abdomen: +BS, soft  Musculoskeletal: mild drawing of face to left  Data Reviewed: Basic Metabolic Panel:  Recent Labs Lab 06/01/14 1110 06/01/14 1129  NA 140 141  K 4.4 4.0  CL 104 105  CO2 24  --   GLUCOSE 100* 107*  BUN 23 23  CREATININE 1.51* 1.60*  CALCIUM 9.9  --    Liver Function Tests:  Recent Labs Lab 06/01/14 1110  AST 22  ALT 10  ALKPHOS 69  BILITOT 0.3  PROT 7.4  ALBUMIN 3.7   No results for input(s): LIPASE, AMYLASE in the last 168 hours. No results for input(s): AMMONIA in the last 168 hours. CBC:  Recent Labs Lab 06/01/14 1110 06/01/14 1129  WBC 6.1  --   NEUTROABS 3.8  --   HGB 13.1 14.3  HCT 37.4 42.0  MCV 90.6  --   PLT 114*  --    Cardiac Enzymes: No results for input(s): CKTOTAL, CKMB, CKMBINDEX, TROPONINI in the last 168 hours. BNP (last 3 results) No results for input(s): PROBNP in the last 8760 hours. CBG: No results for input(s): GLUCAP in the last 168 hours.  No results found for this or any previous visit (from the past 240 hour(s)).   Studies: Dg Chest 2 View  06/01/2014    CLINICAL DATA:  Cerebral infarction, initial encounter  EXAM: CHEST  2 VIEW  COMPARISON:  06/1912  FINDINGS: Mild cardiac enlargement to moderate cardiac enlargement, mildly more prominent when compared to prior study. Uncoiling of the aorta as well as aortic arch calcification. Vascular pattern normal. No edema or consolidation.  IMPRESSION: No acute findings.  Mild to moderate cardiac enlargement.   Electronically Signed   By: Skipper Cliche M.D.   On: 06/01/2014 16:46   Ct Head Wo Contrast  06/01/2014   CLINICAL DATA:  Code stroke. Slurred speech. Left-sided weakness for 1 month  EXAM: CT HEAD WITHOUT CONTRAST  TECHNIQUE: Contiguous axial images were obtained from the base of the skull through the vertex without intravenous contrast.  COMPARISON:  12/08/2012.  FINDINGS: There is patchy low attenuation within the periventricular and subcortical white matter. Chronica right basal ganglia lacunar infarct noted. Prominence of the sulci and ventricles noted.There is no evidence for acute brain infarct, hemorrhage or mass. No extra-axial fluid collection identified. There is partial opacification of the mastoid air cells and the left side of the frontal sinus. Bilateral median antrectomies have been performed. The calvarium appears intact.  IMPRESSION: 1. No acute intracranial abnormalities. 2. Chronic small vessel ischemic disease and brain atrophy.   Electronically Signed   By: Queen Slough.D.  On: 06/01/2014 11:37   Mr Brain Wo Contrast  06/01/2014   CLINICAL DATA:  Left-sided weakness for 1 month. New aphasia. Stroke.  EXAM: MRI HEAD WITHOUT CONTRAST  MRA HEAD WITHOUT CONTRAST  TECHNIQUE: Multiplanar, multiecho pulse sequences of the brain and surrounding structures were obtained without intravenous contrast. Angiographic images of the head were obtained using MRA technique without contrast.  COMPARISON:  Head CT 06/01/2014  FINDINGS: MRI HEAD FINDINGS  Images are mildly degraded by motion artifact.   There is a 1.5 cm acute infarct in the left corona radiata. There is no evidence of intracranial hemorrhage, mass, midline shift, or extra-axial fluid collection. There is moderate cerebral atrophy. Patchy T2 hyperintensities throughout the subcortical and deep cerebral white matter are compatible with moderate chronic small vessel ischemic disease. Small, chronic infarcts are present in the pons extending into the left middle cerebellar peduncle and in the right basal ganglia and bilateral thalami.  Prior bilateral cataract extraction is noted. There is evidence of chronic right maxillary sinusitis. Small to moderate bilateral mastoid effusions are present. Distal right vertebral artery flow void is small and not well seen. Other major intracranial vascular flow voids are grossly preserved.  MRA HEAD FINDINGS  Images are mildly degraded by motion artifact. Visualized distal left vertebral artery is patent and dominant. Distal right vertebral artery appears hypoplastic, particularly its intracranial portion which is not well visualized, particularly distally. PICA origins are patent bilaterally. Focal contour irregularity of the proximal basilar artery is suggestive of a small fenestration, although evaluation is partially limited by motion artifact through the inferior aspect of the abnormality. Basilar artery is otherwise patent and without stenosis. SCA origins are patent. Proximal PCAs are patent without stenosis. Bilateral PCA branch vessel irregularity is noted which may reflect a combination of atherosclerosis and motion artifact.  Visualized distal cervical internal carotid arteries are tortuous. Intracranial ICAs are patent to the carotid termini, with mild ectasia of the cavernous segments noted. M1 segments are patent without evidence of significant stenosis. MCA branch vessel evaluation is limited by motion with mild-to-moderate irregularity noted. There is mild-to-moderate diffuse bilateral ACA  irregularity. No definite intracranial aneurysm is identified.  IMPRESSION: 1. Small, acute left corona radiata infarct. 2. Moderate chronic small vessel ischemic disease and chronic infarcts as above. 3. Mildly motion degraded MRA, limiting branch vessel evaluation. No evidence of major intracranial arterial occlusion or flow-limiting proximal stenosis.   Electronically Signed   By: Logan Bores   On: 06/01/2014 18:32   Mr Jodene Nam Head/brain Wo Cm  06/01/2014   CLINICAL DATA:  Left-sided weakness for 1 month. New aphasia. Stroke.  EXAM: MRI HEAD WITHOUT CONTRAST  MRA HEAD WITHOUT CONTRAST  TECHNIQUE: Multiplanar, multiecho pulse sequences of the brain and surrounding structures were obtained without intravenous contrast. Angiographic images of the head were obtained using MRA technique without contrast.  COMPARISON:  Head CT 06/01/2014  FINDINGS: MRI HEAD FINDINGS  Images are mildly degraded by motion artifact.  There is a 1.5 cm acute infarct in the left corona radiata. There is no evidence of intracranial hemorrhage, mass, midline shift, or extra-axial fluid collection. There is moderate cerebral atrophy. Patchy T2 hyperintensities throughout the subcortical and deep cerebral white matter are compatible with moderate chronic small vessel ischemic disease. Small, chronic infarcts are present in the pons extending into the left middle cerebellar peduncle and in the right basal ganglia and bilateral thalami.  Prior bilateral cataract extraction is noted. There is evidence of chronic right maxillary sinusitis. Small  to moderate bilateral mastoid effusions are present. Distal right vertebral artery flow void is small and not well seen. Other major intracranial vascular flow voids are grossly preserved.  MRA HEAD FINDINGS  Images are mildly degraded by motion artifact. Visualized distal left vertebral artery is patent and dominant. Distal right vertebral artery appears hypoplastic, particularly its intracranial portion  which is not well visualized, particularly distally. PICA origins are patent bilaterally. Focal contour irregularity of the proximal basilar artery is suggestive of a small fenestration, although evaluation is partially limited by motion artifact through the inferior aspect of the abnormality. Basilar artery is otherwise patent and without stenosis. SCA origins are patent. Proximal PCAs are patent without stenosis. Bilateral PCA branch vessel irregularity is noted which may reflect a combination of atherosclerosis and motion artifact.  Visualized distal cervical internal carotid arteries are tortuous. Intracranial ICAs are patent to the carotid termini, with mild ectasia of the cavernous segments noted. M1 segments are patent without evidence of significant stenosis. MCA branch vessel evaluation is limited by motion with mild-to-moderate irregularity noted. There is mild-to-moderate diffuse bilateral ACA irregularity. No definite intracranial aneurysm is identified.  IMPRESSION: 1. Small, acute left corona radiata infarct. 2. Moderate chronic small vessel ischemic disease and chronic infarcts as above. 3. Mildly motion degraded MRA, limiting branch vessel evaluation. No evidence of major intracranial arterial occlusion or flow-limiting proximal stenosis.   Electronically Signed   By: Logan Bores   On: 06/01/2014 18:32    Scheduled Meds: . aspirin  300 mg Rectal Daily   Or  . aspirin  325 mg Oral Daily  . enoxaparin (LOVENOX) injection  40 mg Subcutaneous Q24H   Continuous Infusions:  Antibiotics Given (last 72 hours)    None      Active Problems:   Mixed hyperlipidemia   Essential hypertension, benign   CVA (cerebral infarction)    Time spent: 37 min    Randon Somera  [facility] Pager [phone]. If 7PM-7AM, please contact night-coverage at [email or web address], password [facility] 06/02/2014, 8:03 AM  LOS: 1 day

## 2014-06-02 NOTE — Progress Notes (Signed)
Occupational Therapy Evaluation Patient Details Name: Brenda Werner MRN: [id number] DOB: 1930-04-05 Today's Date: 06/02/2014    History of Present Illness Admitted on 06/01/14 with difficulty speaking. + small L corona radiata infarct PMH significant for dementia, HTN, left THR, right TKR   Clinical Impression   Patient evaluated by Occupational Therapy with no further acute OT needs identified. All education has been completed and the patient has no further questions. See below for any follow-up Occupational Therapy or equipment needs. OT to sign off. Thank you for referral.     Follow Up Recommendations  No OT follow up;Supervision - Intermittent    Equipment Recommendations  None recommended by OT    Recommendations for Other Services       Precautions / Restrictions Precautions Precautions: Fall Restrictions Weight Bearing Restrictions: No      Mobility Bed Mobility               General bed mobility comments: Pt seated EOB on OT arrival  Transfers Overall transfer level: Modified independent Equipment used: Straight cane (with broad tip; stands alone)                  Balance Overall balance assessment: Needs assistance Sitting-balance support: No upper extremity supported;Feet supported Sitting balance-Leahy Scale: Good Sitting balance - Comments: with knee extension, she loses balance posteriorly, however catches herself and corrects Postural control: Posterior lean Standing balance support: No upper extremity supported;During functional activity Standing balance-Leahy Scale: Good    ADL Overall ADL's : Needs assistance/impaired     Grooming: Wash/dry hands;Wash/dry face;Oral care;Modified independent;Standing   Upper Body Bathing: Supervision/ safety;Standing               Toilet Transfer: Modified Independent;Ambulation;Regular Toilet;Grab bars   Toileting- Clothing Manipulation and Hygiene: Modified independent;Sit to/from  stand       Functional mobility during ADLs: Supervision/safety;Cane General ADL Comments: Pt completed sink level ADLs, transfers, and functional mobility at a supervision-mod I level. Pt very talkative and able to carry on a conversation while completing ADLs. Pt reports she ambulates to bathroom without assistive device during the night at home. Pt simulated toilet transfer without assistive device and had no overt LOB.     Vision Eye Alignment: Within Functional Limits Alignment/Gaze Preference: Within Defined Limits Ocular Range of Motion: Within Functional Limits Tracking/Visual Pursuits: Decreased smoothness of horizontal tracking;Other (comment) (Slight nystagmus noted)   Convergence: Within functional limits         Perception     Praxis      Pertinent Vitals/Pain Pain Assessment: No/denies pain     Hand Dominance Left   Extremity/Trunk Assessment Upper Extremity Assessment Upper Extremity Assessment: Overall WFL for tasks assessed   Lower Extremity Assessment Lower Extremity Assessment: Defer to PT evaluation   Cervical / Trunk Assessment Cervical / Trunk Assessment: Kyphotic   Communication Communication Communication: Expressive difficulties   Cognition Arousal/Alertness: Awake/alert Behavior During Therapy: WFL for tasks assessed/performed Overall Cognitive Status: History of cognitive impairments - at baseline                     General Comments       Exercises       Shoulder Instructions      Home Living Family/patient expects to be discharged to:: Private residence Living Arrangements: Children (Daughter) Available Help at Discharge: Family;Friend(s) Type of Home: Apartment Home Access: Stairs to enter [employer] of Steps: 6 Entrance Stairs-Rails: Right Home Layout: One level  Bathroom Shower/Tub: Tub/shower unit;Curtain Shower/tub characteristics: Architectural technologist: Standard Bathroom Accessibility:  No   Home Equipment: Cane - single point;Tub bench;Toilet riser;Grab bars - tub/shower;Hand held Tourist information centre manager - 2 wheels      Lives With: Daughter    Prior Functioning/Environment Level of Independence: Independent with assistive device(s)     OT Diagnosis: Generalized weakness;Cognitive deficits   OT Problem List: Decreased activity tolerance;Impaired balance (sitting and/or standing);Decreased cognition;Decreased safety awareness   OT Treatment/Interventions:      OT Goals(Current goals can be found in the care plan section) Acute Rehab OT Goals Patient Stated Goal: to get back to [facility] OT Goal Formulation: With patient Time For Goal Achievement: 06/16/14 Potential to Achieve Goals: Good  OT Frequency:     Barriers to D/C:            Co-evaluation              End of Session Equipment Utilized During Treatment: Gait belt;Other (comment) Kasandra Knudsen) Nurse Communication: Mobility status;Precautions  Activity Tolerance: Patient tolerated treatment well Patient left: in bed;with call bell/phone within reach;with family/visitor present   Time: 5686-1683 OT Time Calculation (min): 34 min Charges:    G-Codes:    Redmond Baseman 06/07/2014, 3:41 PM

## 2014-06-02 NOTE — Progress Notes (Signed)
*  PRELIMINARY RESULTS* Vascular Ultrasound Carotid Duplex (Doppler) has been completed.  Preliminary findings: Bilateral:  1-39% ICA stenosis.  Vertebral artery flow is antegrade.      Landry Mellow, RDMS, RVT  06/02/2014, 11:12 AM

## 2014-06-02 NOTE — Progress Notes (Signed)
STROKE TEAM PROGRESS NOTE   HISTORY Brenda Werner is an 80 y.o. female Who was taking part in rehabilitation at [facility] when staff noted a acute onset of dysarthria. In talking with patient , she noted she was having difficulty with her speech dating back to Saturday 05/30/2014. Patient was brought to ED as a Code stroke. On arrival she did show significant dysarthria. She had some complaints of decreased sensation on the left buttock to leg but stated that occurred one months. Ago. Patient was brought back to CT and initial head CT was negative. Patient was not a tPA candidate due to being out of window. she was last known well 05/30/2014, time known. Patient was not administered TPA secondary to delay in arrival. She was admitted for further evaluation and treatment.   SUBJECTIVE (INTERVAL HISTORY) Her daughter is at the bedside.  Overall she feels her condition is gradually improving. Patient slow speech has now returned to normal a dysarthria remains and she is difficult to understand.   OBJECTIVE Temp:  [97.6 F (36.4 C)-98.8 F (37.1 C)] 97.9 F (36.6 C) (11/17 0606) Pulse Rate:  [65-89] 76 (11/17 0606) Cardiac Rhythm:  [-] Normal sinus rhythm (11/17 0800) Resp:  [12-27] 18 (11/17 0606) BP: (110-169)/(63-85) 114/66 mmHg (11/17 0924) SpO2:  [95 %-99 %] 95 % (11/17 0606) Weight:  [68.04 kg (150 lb)] 68.04 kg (150 lb) (11/17 0800)  No results for input(s): GLUCAP in the last 168 hours.  Recent Labs Lab 06/01/14 1110 06/01/14 1129  NA 140 141  K 4.4 4.0  CL 104 105  CO2 24  --   GLUCOSE 100* 107*  BUN 23 23  CREATININE 1.51* 1.60*  CALCIUM 9.9  --     Recent Labs Lab 06/01/14 1110  AST 22  ALT 10  ALKPHOS 69  BILITOT 0.3  PROT 7.4  ALBUMIN 3.7    Recent Labs Lab 06/01/14 1110 06/01/14 1129  WBC 6.1  --   NEUTROABS 3.8  --   HGB 13.1 14.3  HCT 37.4 42.0  MCV 90.6  --   PLT 114*  --    No results for input(s): CKTOTAL, CKMB, CKMBINDEX, TROPONINI in the  last 168 hours.  Recent Labs  06/01/14 1110  LABPROT 13.8  INR 1.05    Recent Labs  06/01/14 1225  COLORURINE YELLOW  LABSPEC 1.008  PHURINE 7.5  GLUCOSEU NEGATIVE  HGBUR NEGATIVE  BILIRUBINUR NEGATIVE  KETONESUR NEGATIVE  PROTEINUR NEGATIVE  UROBILINOGEN 0.2  NITRITE NEGATIVE  LEUKOCYTESUR NEGATIVE       Component Value Date/Time   CHOL 155 06/02/2014 0609   TRIG 91 06/02/2014 0609   HDL 66 06/02/2014 0609   CHOLHDL 2.3 06/02/2014 0609   VLDL 18 06/02/2014 0609   LDLCALC 71 06/02/2014 0609   Lab Results  Component Value Date   HGBA1C 5.1 06/02/2014      Component Value Date/Time   LABOPIA NONE DETECTED 06/01/2014 1225   COCAINSCRNUR NONE DETECTED 06/01/2014 1225   LABBENZ NONE DETECTED 06/01/2014 1225   AMPHETMU NONE DETECTED 06/01/2014 1225   THCU NONE DETECTED 06/01/2014 1225   LABBARB NONE DETECTED 06/01/2014 1225     Recent Labs Lab 06/01/14 1110  ETH <11    Dg Chest 2 View  06/01/2014   CLINICAL DATA:  Cerebral infarction, initial encounter  EXAM: CHEST  2 VIEW  COMPARISON:  06/1912  FINDINGS: Mild cardiac enlargement to moderate cardiac enlargement, mildly more prominent when compared to prior study. Uncoiling of the  aorta as well as aortic arch calcification. Vascular pattern normal. No edema or consolidation.  IMPRESSION: No acute findings.  Mild to moderate cardiac enlargement.   Electronically Signed   By: Skipper Cliche M.D.   On: 06/01/2014 16:46   Ct Head Wo Contrast  06/01/2014   CLINICAL DATA:  Code stroke. Slurred speech. Left-sided weakness for 1 month  EXAM: CT HEAD WITHOUT CONTRAST  TECHNIQUE: Contiguous axial images were obtained from the base of the skull through the vertex without intravenous contrast.  COMPARISON:  12/08/2012.  FINDINGS: There is patchy low attenuation within the periventricular and subcortical white matter. Chronica right basal ganglia lacunar infarct noted. Prominence of the sulci and ventricles noted.There is no  evidence for acute brain infarct, hemorrhage or mass. No extra-axial fluid collection identified. There is partial opacification of the mastoid air cells and the left side of the frontal sinus. Bilateral median antrectomies have been performed. The calvarium appears intact.  IMPRESSION: 1. No acute intracranial abnormalities. 2. Chronic small vessel ischemic disease and brain atrophy.   Electronically Signed   By: Kerby Moors M.D.   On: 06/01/2014 11:37   Mr Brain Wo Contrast  06/01/2014   CLINICAL DATA:  Left-sided weakness for 1 month. New aphasia. Stroke.  EXAM: MRI HEAD WITHOUT CONTRAST  MRA HEAD WITHOUT CONTRAST  TECHNIQUE: Multiplanar, multiecho pulse sequences of the brain and surrounding structures were obtained without intravenous contrast. Angiographic images of the head were obtained using MRA technique without contrast.  COMPARISON:  Head CT 06/01/2014  FINDINGS: MRI HEAD FINDINGS  Images are mildly degraded by motion artifact.  There is a 1.5 cm acute infarct in the left corona radiata. There is no evidence of intracranial hemorrhage, mass, midline shift, or extra-axial fluid collection. There is moderate cerebral atrophy. Patchy T2 hyperintensities throughout the subcortical and deep cerebral white matter are compatible with moderate chronic small vessel ischemic disease. Small, chronic infarcts are present in the pons extending into the left middle cerebellar peduncle and in the right basal ganglia and bilateral thalami.  Prior bilateral cataract extraction is noted. There is evidence of chronic right maxillary sinusitis. Small to moderate bilateral mastoid effusions are present. Distal right vertebral artery flow void is small and not well seen. Other major intracranial vascular flow voids are grossly preserved.  MRA HEAD FINDINGS  Images are mildly degraded by motion artifact. Visualized distal left vertebral artery is patent and dominant. Distal right vertebral artery appears hypoplastic,  particularly its intracranial portion which is not well visualized, particularly distally. PICA origins are patent bilaterally. Focal contour irregularity of the proximal basilar artery is suggestive of a small fenestration, although evaluation is partially limited by motion artifact through the inferior aspect of the abnormality. Basilar artery is otherwise patent and without stenosis. SCA origins are patent. Proximal PCAs are patent without stenosis. Bilateral PCA branch vessel irregularity is noted which may reflect a combination of atherosclerosis and motion artifact.  Visualized distal cervical internal carotid arteries are tortuous. Intracranial ICAs are patent to the carotid termini, with mild ectasia of the cavernous segments noted. M1 segments are patent without evidence of significant stenosis. MCA branch vessel evaluation is limited by motion with mild-to-moderate irregularity noted. There is mild-to-moderate diffuse bilateral ACA irregularity. No definite intracranial aneurysm is identified.  IMPRESSION: 1. Small, acute left corona radiata infarct. 2. Moderate chronic small vessel ischemic disease and chronic infarcts as above. 3. Mildly motion degraded MRA, limiting branch vessel evaluation. No evidence of major intracranial arterial occlusion or  flow-limiting proximal stenosis.   Electronically Signed   By: Logan Bores   On: 06/01/2014 18:32   Mr Jodene Nam Head/brain Wo Cm  06/01/2014   CLINICAL DATA:  Left-sided weakness for 1 month. New aphasia. Stroke.  EXAM: MRI HEAD WITHOUT CONTRAST  MRA HEAD WITHOUT CONTRAST  TECHNIQUE: Multiplanar, multiecho pulse sequences of the brain and surrounding structures were obtained without intravenous contrast. Angiographic images of the head were obtained using MRA technique without contrast.  COMPARISON:  Head CT 06/01/2014  FINDINGS: MRI HEAD FINDINGS  Images are mildly degraded by motion artifact.  There is a 1.5 cm acute infarct in the left corona radiata. There  is no evidence of intracranial hemorrhage, mass, midline shift, or extra-axial fluid collection. There is moderate cerebral atrophy. Patchy T2 hyperintensities throughout the subcortical and deep cerebral white matter are compatible with moderate chronic small vessel ischemic disease. Small, chronic infarcts are present in the pons extending into the left middle cerebellar peduncle and in the right basal ganglia and bilateral thalami.  Prior bilateral cataract extraction is noted. There is evidence of chronic right maxillary sinusitis. Small to moderate bilateral mastoid effusions are present. Distal right vertebral artery flow void is small and not well seen. Other major intracranial vascular flow voids are grossly preserved.  MRA HEAD FINDINGS  Images are mildly degraded by motion artifact. Visualized distal left vertebral artery is patent and dominant. Distal right vertebral artery appears hypoplastic, particularly its intracranial portion which is not well visualized, particularly distally. PICA origins are patent bilaterally. Focal contour irregularity of the proximal basilar artery is suggestive of a small fenestration, although evaluation is partially limited by motion artifact through the inferior aspect of the abnormality. Basilar artery is otherwise patent and without stenosis. SCA origins are patent. Proximal PCAs are patent without stenosis. Bilateral PCA branch vessel irregularity is noted which may reflect a combination of atherosclerosis and motion artifact.  Visualized distal cervical internal carotid arteries are tortuous. Intracranial ICAs are patent to the carotid termini, with mild ectasia of the cavernous segments noted. M1 segments are patent without evidence of significant stenosis. MCA branch vessel evaluation is limited by motion with mild-to-moderate irregularity noted. There is mild-to-moderate diffuse bilateral ACA irregularity. No definite intracranial aneurysm is identified.   IMPRESSION: 1. Small, acute left corona radiata infarct. 2. Moderate chronic small vessel ischemic disease and chronic infarcts as above. 3. Mildly motion degraded MRA, limiting branch vessel evaluation. No evidence of major intracranial arterial occlusion or flow-limiting proximal stenosis.   Electronically Signed   By: Logan Bores   On: 06/01/2014 18:32   Carotid Doppler  There is 1-39% bilateral ICA stenosis. Vertebral artery flow is antegrade.     PHYSICAL EXAM Pleasant elderly african american lady not in distress.Awake alert. Afebrile. Head is nontraumatic. Neck is supple without bruit. Hearing is normal. Cardiac exam no murmur or gallop. Lungs are clear to auscultation. Distal pulses are well felt. Neurological Exam ; awake alert oriented 2. Diminished attention and recall. Speech is slightly hesitant and mildly dysarthric but can be understood with some difficulty. No aphasia or apraxia. Extraocular movements are full range without nystagmus. Fundi were not visualized. Vision acuity seems adequate. Mild right lower facial asymmetry. Jaw jerk is brisk.Tongue midline. Motor system exam reveals no upper or lower extremity drift. No focal weakness. Slightly diminished fine finger movements on the right. Sensation appears intact. Coordination is slow but accurate. Deep tendon reflexes are brisk bilaterally.Gait was not tested. Plantars are downgoing.  ASSESSMENT/PLAN Ms.  Brenda Werner is a 80 y.o. female with history of hypertension, hyperlipidemia and unknown irregular heartbeat presenting with severe dysarthria. She did not receive IV t-PA due to delay in arrival.   Stroke:  Dominant left corona radiata infarct secondary to small vessel disease source Old pontine, left middle cerebellar peduncle, right basal ganglia and bilateral thalamic infarcts. Severe small vessel disease  Resultant  Pseudobulbar speech given bilateral infarcts  (new and old), brisk jaw jerk, brisk reflexes  MRI  Left  corona radiata infarct, small vessel disease  MRA  No significant stenosis  Carotid Doppler  No significant stenosis  2D Echo   pending   HgbA1c 5.1  Lovenox 40 mg sq daily for VTE prophylaxis  Diet Heart thin liquids  recorded as takingaspirin 81 mg orally every day prior to admission, however, pt states she was not taking. now on aspirin 325 mg orally every day in hospital. Will continue at discharge.  Ongoing aggressive risk factor management  Therapy recommendations:  No PT or OT. Speech therapy pending  Disposition:  Anticipate return home with outpatient speech therapy  Hypertension  Home meds:   Norvasc, Coreg, valsartan-hydrochlorothiazide  Stable  Hyperlipidemia  Home meds:  lipitor 20, resumed in hospital  LDL 71, almost at goal of < 70  Continue statin at discharge  Other Stroke Risk Factors  Advanced age  Other Pertinent History  Restless Leg Syndrome. On Neurontin 200 mg at night. Will increase to 300 mg q hs.    Hospital day # 1  Burnetta Sabin, MSN, APRN, ANVP-BC, AGPCNP-BC [facility] Pager: [phone] 06/02/2014 12:02 PM  I have personally examined this patient, reviewed notes, independently viewed imaging studies, participated in medical decision making and plan of care. I have made any additions or clarifications directly to the above note. Agree with note above. She has a small subcortical infarct secondary to small vessel disease. MRI also shows several old silent lacunar infarcts and neurological exams suggest mild pseudobulbar palsy.  Antony Contras, MD Medical Director [facility] Pager: [phone] 06/02/2014 8:05 PM    To contact Stroke Continuity provider, please refer to [email or web address] After hours, contact General Neurology

## 2014-06-02 NOTE — Evaluation (Signed)
Physical Therapy Evaluation Patient Details Name: Brenda Werner MRN: [id number] DOB: [birth year] Today's Date: 06/02/2014   History of Present Illness  Adm 06/01/14 with difficulty speaking. + small Lt corona radiata infarct PMHx- dementia, HTN, Lt THR, Rt TKR  Clinical Impression  Patient evaluated by Physical Therapy with no further acute PT needs identified. All education has been completed and the patient has no further questions. Daughter present throughout and agrees pt is at her baseline with balance and walking. See below for any follow-up Physial Therapy or equipment needs. PT is signing off. Thank you for this referral.     Follow Up Recommendations Other (comment);Supervision for mobility/OOB (pt receives PT at [facility])    Equipment Recommendations  None recommended by PT    Recommendations for Other Services Speech consult     Precautions / Restrictions Precautions Precautions: Fall      Mobility  Bed Mobility                  Transfers Overall transfer level: Modified independent Equipment used: Straight cane (with broad tip (stands alone))                Ambulation/Gait Ambulation/Gait assistance: Supervision;Modified independent (Device/Increase time) Ambulation Distance (Feet): 200 Feet Assistive device: Straight cane Gait Pattern/deviations: Step-through pattern;Decreased stride length;Antalgic Gait velocity: decr with little ability to incr   General Gait Details: reports arthritis causes her to limp  Stairs            Wheelchair Mobility    Modified Rankin (Stroke Patients Only) Modified Rankin (Stroke Patients Only) Pre-Morbid Rankin Score: No significant disability Modified Rankin: No significant disability     Balance Overall balance assessment: Needs assistance Sitting-balance support: Feet supported;No upper extremity supported Sitting balance-Leahy Scale: Good Sitting balance - Comments: with knee extension, she  loses balance posteriorly, however catches herself and corrects Postural control: Posterior lean Standing balance support: No upper extremity supported Standing balance-Leahy Scale: Good                   Standardized Balance Assessment Standardized Balance Assessment : Berg Balance Test Berg Balance Test Sit to Stand: Able to stand  independently using hands Standing Unsupported: Able to stand safely 2 minutes Sitting with Back Unsupported but Feet Supported on Floor or Stool: Able to sit safely and securely 2 minutes Stand to Sit: Sits safely with minimal use of hands Transfers: Able to transfer safely, minor use of hands Standing Unsupported with Eyes Closed: Able to stand 10 seconds safely Standing Ubsupported with Feet Together: Able to place feet together independently and stand for 1 minute with supervision From Standing, Reach Forward with Outstretched Arm: Can reach forward >12 cm safely (5") From Standing Position, Pick up Object from Floor: Able to pick up shoe safely and easily From Standing Position, Turn to Look Behind Over each Shoulder: Looks behind one side only/other side shows less weight shift Turn 360 Degrees: Able to turn 360 degrees safely in 4 seconds or less (7 steps) Standing Unsupported, One Foot in Front: Able to take small step independently and hold 30 seconds Standing on One Leg: Tries to lift leg/unable to hold 3 seconds but remains standing independently         Pertinent Vitals/Pain Pain Assessment: No/denies pain    Home Living Family/patient expects to be discharged to:: Private residence Living Arrangements: Children (Daughter) Available Help at Discharge: Family;Friend(s) Type of Home: Apartment Home Access: Stairs to enter Entrance Stairs-Rails: Right Entrance Stairs-Number  of Steps: 6 Home Layout: One level Home Equipment: Cane - single point      Prior Function Level of Independence: Independent with assistive device(s)          Comments: [facility]; uses cane due to decr balance; denies falling, however does stagger     Hand Dominance   Dominant Hand: Left    Extremity/Trunk Assessment   Upper Extremity Assessment: Defer to OT evaluation           Lower Extremity Assessment: Overall WFL for tasks assessed (5/5 knee extension bil)      Cervical / Trunk Assessment: Kyphotic  Communication   Communication: Expressive difficulties  Cognition Arousal/Alertness: Awake/alert Behavior During Therapy: WFL for tasks assessed/performed Overall Cognitive Status: History of cognitive impairments - at baseline                      General Comments General comments (skin integrity, edema, etc.): Daughter present    Exercises        Assessment/Plan    PT Assessment All further PT needs can be met in the next venue of care  PT Diagnosis Abnormality of gait   PT Problem List    PT Treatment Interventions     PT Goals (Current goals can be found in the Care Plan section) Acute Rehab PT Goals Patient Stated Goal: return home PT Goal Formulation: All assessment and education complete, DC therapy    Frequency     Barriers to discharge        Co-evaluation               End of Session Equipment Utilized During Treatment: Gait belt Activity Tolerance: Patient tolerated treatment well Patient left: in bed;with call bell/phone within reach;with family/visitor present (sitting EOB as on arrival)           Time: 1353-1414 PT Time Calculation (min) (ACUTE ONLY): 21 min   Charges:   PT Evaluation $Initial PT Evaluation Tier I: 1 Procedure     PT G Codes:          Agustin Swatek 06-11-2014, 2:28 PM  Pager [phone]

## 2014-06-02 NOTE — Evaluation (Signed)
Speech Language Pathology Evaluation Patient Details Name: LIDWINA KANER MRN: [id number] DOB: 1929-09-28 Today's Date: 06/02/2014 Time: 7782-4235 SLP Time Calculation (min) (ACUTE ONLY): 32 min  Problem List:  Patient Active Problem List   Diagnosis Date Noted  . Cerebral infarction due to unspecified mechanism   . CVA (cerebral infarction) 06/01/2014  . Pain due to total hip replacement 07/08/2012    Class: Chronic  . Encounter for IUD removal 11/02/2011  . Mixed hyperlipidemia 12/30/2009  . Essential hypertension, benign 12/30/2009  . ELECTROCARDIOGRAM, ABNORMAL 12/30/2009   Past Medical History:  Past Medical History  Diagnosis Date  . Anxiety   . Arthritis   . Cataract     had surgery  . Depression   . GERD (gastroesophageal reflux disease)   . Heart murmur   . Irregular heart beat     doesn't know type  . Hyperlipidemia   . Hypertension   . Alzheimer's dementia    Past Surgical History:  Past Surgical History  Procedure Laterality Date  . Shoulder arthroscopy      twice on right  . Hip surgery      replacement, left  . Replacement total knee      right  . Foot fracture surgery      at ankle, left  . Joint replacement    . Fracture surgery    . Eye surgery    . Cataract extraction w/ intraocular lens  implant, bilateral  2013  . Total hip revision  07/08/2012    Procedure: TOTAL HIP REVISION;  Surgeon: Jessy Oto, MD;  Location: [facility];  Service: Orthopedics;  Laterality: Left;  Revision of left hip ASR to Gryption acetabulum with poly and ceramic femoral head, removal of ASR head and acetabular shell   HPI:  Brenda Werner is an 80 y.o. female who was taking part in rehabilitation at [facility] when staff noted an acute onset of dysarthria. In talking with patient, she noted she was having difficulty with her speech dating back to Saturday 05/30/2014.  Patient was brought to ED as a Code stroke.  On arrival, she did show significant dysarthria. She had some  complaints of decreased sensation on the left buttock to leg but stated that occurred one month ago. Patient was brought back to CT and initial head CT was negative.  Patient was not a tPA candidate due to being out of window.    Assessment / Plan / Recommendation Clinical Impression  Orders received; oral motor speech evaluation complete. Patient presents with a moderate-severe dysarthria characterized by imprecise consonants and increased rate of speech which impacts patient's overall intelligibility. SLP educated patient and patient's daughter on speech intelligibility compensatory strategies of slow rate and over-articulation as well as oral motor exercises to improve intelligibility. Patient would benefit from skilled SLP intervention to maximize speech intelligibility in order to maximize her functional independence prior to discharge.     SLP Assessment  Patient needs continued Speech Lanaguage Pathology Services    Follow Up Recommendations  Other (comment) ([facility])    Frequency and Duration min 2x/week  1 week   Pertinent Vitals/Pain Pain Assessment: No/denies pain   SLP Goals  Patient/Family Stated Goal: Improved overall intelligibility  Potential to Achieve Goals (ACUTE ONLY): Good  SLP Evaluation Prior Functioning  Cognitive/Linguistic Baseline: Within functional limits Type of Home: Apartment  Lives With: Daughter Available Help at Discharge: Family;Friend(s)   Cognition     Comprehension     Expression   Oral /  Motor Oral Motor/Sensory Function Overall Oral Motor/Sensory Function: Impaired Labial ROM: Reduced right Labial Symmetry: Abnormal symmetry right Labial Strength: Reduced Labial Sensation: Within Functional Limits Lingual ROM: Reduced right Lingual Symmetry: Abnormal symmetry right Lingual Strength: Within Functional Limits Lingual Sensation: Within Functional Limits Facial ROM: Reduced right Facial Symmetry: Right droop Facial Strength: Within  Functional Limits Facial Sensation: Within Functional Limits Velum: Within Functional Limits Mandible: Within Functional Limits Motor Speech Overall Motor Speech: Impaired Respiration: Within functional limits Phonation: Normal Resonance: Within functional limits Articulation: Impaired Level of Impairment: Word Intelligibility: Intelligibility reduced Word: 75-100% accurate Phrase: 50-74% accurate Sentence: 25-49% accurate Conversation: 25-49% accurate Motor Planning: Witnin functional limits Motor Speech Errors: Aware;Consistent Effective Techniques: Slow rate;Over-articulate   GO     Stevon Gough 06/02/2014, 4:47 PM

## 2014-06-02 NOTE — Progress Notes (Signed)
SLP Cancellation Note  Patient Details Name: Brenda Werner MRN: [id number] DOB: 1929-11-23   Cancelled treatment:       Reason Eval/Treat Not Completed: Patient at procedure or test/unavailable.  SLP will follow up as able.  Gunnar Fusi, M.A., CCC-SLP [phone]  [place] 06/02/2014, 9:42 AM

## 2014-06-02 NOTE — Progress Notes (Signed)
  Echocardiogram 2D Echocardiogram has been performed.  Rhiana Morash FRANCES 06/02/2014, 10:50 AM

## 2014-06-02 NOTE — Progress Notes (Signed)
PT Cancellation Note  Patient Details Name: Brenda Werner MRN: [id number] DOB: [birth year]   Cancelled Treatment:    Reason Eval/Treat Not Completed: Patient at procedure or test/unavailable   Irma Delancey 06/02/2014, 10:33 AM  Pager [phone]

## 2014-06-02 NOTE — Progress Notes (Signed)
[facility] is active with [facility]. [facility] is a [facility] that provides community-based services to enrolled individuals who need medical care and support to continue living at home. They will assist in discharge planning - return home or to a facility at discharge; Aneta Mins [phone]

## 2014-06-02 NOTE — Plan of Care (Signed)
Problem: Discharge/Transitional Outcomes Goal: Independent mobility/functioning independent or with min Independent mobility/functioning independently or with minimal assistance  Outcome: Completed/Met Date Met:  06/02/14

## 2014-06-03 DIAGNOSIS — I63012 Cerebral infarction due to thrombosis of left vertebral artery: Secondary | ICD-10-CM

## 2014-06-03 DIAGNOSIS — I639 Cerebral infarction, unspecified: Secondary | ICD-10-CM

## 2014-06-03 MED ORDER — ASPIRIN 325 MG PO TABS: 325.0000 mg | ORAL_TABLET | Freq: Every day | ORAL | Status: AC

## 2014-06-03 MED ORDER — GABAPENTIN 100 MG PO CAPS: 300.0000 mg | ORAL_CAPSULE | Freq: Every day | ORAL | Status: AC

## 2014-06-03 NOTE — Discharge Summary (Signed)
PATIENT DETAILS Name: Brenda Werner Age: 80 y.o. Sex: female Date of Birth: 1930-04-15 MRN: [id number]. Admitting Physician: Geradine Girt, DO FOY:DXAJOIN,OMVEHM NICHOLAS, MD  Admit Date: 06/01/2014 Discharge date: 06/03/2014  Recommendations for Outpatient Follow-up:  1. Will need aggressive risk factor modification for CVA.  PRIMARY DISCHARGE DIAGNOSIS:  Active Problems:   Mixed hyperlipidemia   Essential hypertension, benign   CVA (cerebral infarction)   Cerebral infarction due to unspecified mechanism      PAST MEDICAL HISTORY: Past Medical History  Diagnosis Date  . Anxiety   . Arthritis   . Cataract     had surgery  . Depression   . GERD (gastroesophageal reflux disease)   . Heart murmur   . Irregular heart beat     doesn't know type  . Hyperlipidemia   . Hypertension   . Alzheimer's dementia     DISCHARGE MEDICATIONS: Current Discharge Medication List    START taking these medications   Details  aspirin 325 MG tablet Take 1 tablet (325 mg total) by mouth daily. Qty: 30 tablet, Refills: 0      CONTINUE these medications which have CHANGED   Details  gabapentin (NEURONTIN) 100 MG capsule Take 3 capsules (300 mg total) by mouth at bedtime.      CONTINUE these medications which have NOT CHANGED   Details  amLODipine (NORVASC) 10 MG tablet Take 10 mg by mouth daily.    atorvastatin (LIPITOR) 20 MG tablet Take 20 mg by mouth every evening.     buPROPion (WELLBUTRIN XL) 300 MG 24 hr tablet Take 300 mg by mouth daily.    carvedilol (COREG) 25 MG tablet Take 25 mg by mouth 2 (two) times daily with a meal.    Cholecalciferol 1000 UNITS tablet Take 1,000 Units by mouth daily.    docusate sodium (COLACE) 100 MG capsule Take 100 mg by mouth 2 (two) times daily.    Elastic Bandages & Supports (T.E.D. KNEE LENGTH/L-REGULAR) MISC 1 each by Does not apply route daily.    hydroxypropyl methylcellulose / hypromellose (ISOPTO TEARS / GONIOVISC) 2.5 %  ophthalmic solution Place 1 drop into both eyes See admin instructions. 1 drop each eye twice daily and as needed for dry eyes    Menthol, Topical Analgesic, (BIOFREEZE EX) Apply 1 application topically 3 (three) times daily. 0.2%- 3.5% gel    mirtazapine (REMERON) 15 MG tablet Take 15 mg by mouth at bedtime.    Olopatadine HCl (PATADAY) 0.2 % SOLN Place 1 drop into both eyes daily as needed (for itching).    potassium chloride SA (K-DUR,KLOR-CON) 20 MEQ tablet Take 20 mEq by mouth daily.    rOPINIRole (REQUIP) 0.5 MG tablet Take 0.5 mg by mouth at bedtime.    traMADol (ULTRAM) 50 MG tablet Take 50 mg by mouth every 6 (six) hours as needed for pain.    valsartan-hydrochlorothiazide (DIOVAN-HCT) 160-12.5 MG per tablet Take 1 tablet by mouth daily.      STOP taking these medications     aspirin 81 MG chewable tablet         ALLERGIES:   Allergies  Allergen Reactions  . Lisinopril     Cough   . Latex Rash  . Penicillins Other (See Comments)    unknown    BRIEF HPI:  See H&P, Labs, Consult and Test reports for all details in brief, patient is an 80 y.o. female Who was taking part in rehabilitation at [facility] when staff noted a acute onset  of dysarthria.Further workup including a MRI brain demonstrated an acute CVA.  CONSULTATIONS:   neurology  PERTINENT RADIOLOGIC STUDIES: Dg Chest 2 View  06/01/2014   CLINICAL DATA:  Cerebral infarction, initial encounter  EXAM: CHEST  2 VIEW  COMPARISON:  06/1912  FINDINGS: Mild cardiac enlargement to moderate cardiac enlargement, mildly more prominent when compared to prior study. Uncoiling of the aorta as well as aortic arch calcification. Vascular pattern normal. No edema or consolidation.  IMPRESSION: No acute findings.  Mild to moderate cardiac enlargement.   Electronically Signed   By: Skipper Cliche M.D.   On: 06/01/2014 16:46   Ct Head Wo Contrast  06/01/2014   CLINICAL DATA:  Code stroke. Slurred speech. Left-sided weakness for 1  month  EXAM: CT HEAD WITHOUT CONTRAST  TECHNIQUE: Contiguous axial images were obtained from the base of the skull through the vertex without intravenous contrast.  COMPARISON:  12/08/2012.  FINDINGS: There is patchy low attenuation within the periventricular and subcortical white matter. Chronica right basal ganglia lacunar infarct noted. Prominence of the sulci and ventricles noted.There is no evidence for acute brain infarct, hemorrhage or mass. No extra-axial fluid collection identified. There is partial opacification of the mastoid air cells and the left side of the frontal sinus. Bilateral median antrectomies have been performed. The calvarium appears intact.  IMPRESSION: 1. No acute intracranial abnormalities. 2. Chronic small vessel ischemic disease and brain atrophy.   Electronically Signed   By: Kerby Moors M.D.   On: 06/01/2014 11:37   Mr Brain Wo Contrast  06/01/2014   CLINICAL DATA:  Left-sided weakness for 1 month. New aphasia. Stroke.  EXAM: MRI HEAD WITHOUT CONTRAST  MRA HEAD WITHOUT CONTRAST  TECHNIQUE: Multiplanar, multiecho pulse sequences of the brain and surrounding structures were obtained without intravenous contrast. Angiographic images of the head were obtained using MRA technique without contrast.  COMPARISON:  Head CT 06/01/2014  FINDINGS: MRI HEAD FINDINGS  Images are mildly degraded by motion artifact.  There is a 1.5 cm acute infarct in the left corona radiata. There is no evidence of intracranial hemorrhage, mass, midline shift, or extra-axial fluid collection. There is moderate cerebral atrophy. Patchy T2 hyperintensities throughout the subcortical and deep cerebral white matter are compatible with moderate chronic small vessel ischemic disease. Small, chronic infarcts are present in the pons extending into the left middle cerebellar peduncle and in the right basal ganglia and bilateral thalami.  Prior bilateral cataract extraction is noted. There is evidence of chronic right  maxillary sinusitis. Small to moderate bilateral mastoid effusions are present. Distal right vertebral artery flow void is small and not well seen. Other major intracranial vascular flow voids are grossly preserved.  MRA HEAD FINDINGS  Images are mildly degraded by motion artifact. Visualized distal left vertebral artery is patent and dominant. Distal right vertebral artery appears hypoplastic, particularly its intracranial portion which is not well visualized, particularly distally. PICA origins are patent bilaterally. Focal contour irregularity of the proximal basilar artery is suggestive of a small fenestration, although evaluation is partially limited by motion artifact through the inferior aspect of the abnormality. Basilar artery is otherwise patent and without stenosis. SCA origins are patent. Proximal PCAs are patent without stenosis. Bilateral PCA branch vessel irregularity is noted which may reflect a combination of atherosclerosis and motion artifact.  Visualized distal cervical internal carotid arteries are tortuous. Intracranial ICAs are patent to the carotid termini, with mild ectasia of the cavernous segments noted. M1 segments are patent without evidence of  significant stenosis. MCA branch vessel evaluation is limited by motion with mild-to-moderate irregularity noted. There is mild-to-moderate diffuse bilateral ACA irregularity. No definite intracranial aneurysm is identified.  IMPRESSION: 1. Small, acute left corona radiata infarct. 2. Moderate chronic small vessel ischemic disease and chronic infarcts as above. 3. Mildly motion degraded MRA, limiting branch vessel evaluation. No evidence of major intracranial arterial occlusion or flow-limiting proximal stenosis.   Electronically Signed   By: Logan Bores   On: 06/01/2014 18:32   Mr Jodene Nam Head/brain Wo Cm  06/01/2014   CLINICAL DATA:  Left-sided weakness for 1 month. New aphasia. Stroke.  EXAM: MRI HEAD WITHOUT CONTRAST  MRA HEAD WITHOUT  CONTRAST  TECHNIQUE: Multiplanar, multiecho pulse sequences of the brain and surrounding structures were obtained without intravenous contrast. Angiographic images of the head were obtained using MRA technique without contrast.  COMPARISON:  Head CT 06/01/2014  FINDINGS: MRI HEAD FINDINGS  Images are mildly degraded by motion artifact.  There is a 1.5 cm acute infarct in the left corona radiata. There is no evidence of intracranial hemorrhage, mass, midline shift, or extra-axial fluid collection. There is moderate cerebral atrophy. Patchy T2 hyperintensities throughout the subcortical and deep cerebral white matter are compatible with moderate chronic small vessel ischemic disease. Small, chronic infarcts are present in the pons extending into the left middle cerebellar peduncle and in the right basal ganglia and bilateral thalami.  Prior bilateral cataract extraction is noted. There is evidence of chronic right maxillary sinusitis. Small to moderate bilateral mastoid effusions are present. Distal right vertebral artery flow void is small and not well seen. Other major intracranial vascular flow voids are grossly preserved.  MRA HEAD FINDINGS  Images are mildly degraded by motion artifact. Visualized distal left vertebral artery is patent and dominant. Distal right vertebral artery appears hypoplastic, particularly its intracranial portion which is not well visualized, particularly distally. PICA origins are patent bilaterally. Focal contour irregularity of the proximal basilar artery is suggestive of a small fenestration, although evaluation is partially limited by motion artifact through the inferior aspect of the abnormality. Basilar artery is otherwise patent and without stenosis. SCA origins are patent. Proximal PCAs are patent without stenosis. Bilateral PCA branch vessel irregularity is noted which may reflect a combination of atherosclerosis and motion artifact.  Visualized distal cervical internal carotid  arteries are tortuous. Intracranial ICAs are patent to the carotid termini, with mild ectasia of the cavernous segments noted. M1 segments are patent without evidence of significant stenosis. MCA branch vessel evaluation is limited by motion with mild-to-moderate irregularity noted. There is mild-to-moderate diffuse bilateral ACA irregularity. No definite intracranial aneurysm is identified.  IMPRESSION: 1. Small, acute left corona radiata infarct. 2. Moderate chronic small vessel ischemic disease and chronic infarcts as above. 3. Mildly motion degraded MRA, limiting branch vessel evaluation. No evidence of major intracranial arterial occlusion or flow-limiting proximal stenosis.   Electronically Signed   By: Logan Bores   On: 06/01/2014 18:32     PERTINENT LAB RESULTS: CBC:  Recent Labs  06/01/14 1110 06/01/14 1129  WBC 6.1  --   HGB 13.1 14.3  HCT 37.4 42.0  PLT 114*  --    CMET CMP     Component Value Date/Time   NA 141 06/01/2014 1129   K 4.0 06/01/2014 1129   CL 105 06/01/2014 1129   CO2 24 06/01/2014 1110   GLUCOSE 107* 06/01/2014 1129   BUN 23 06/01/2014 1129   CREATININE 1.60* 06/01/2014 1129   CALCIUM  9.9 06/01/2014 1110   PROT 7.4 06/01/2014 1110   ALBUMIN 3.7 06/01/2014 1110   AST 22 06/01/2014 1110   ALT 10 06/01/2014 1110   ALKPHOS 69 06/01/2014 1110   BILITOT 0.3 06/01/2014 1110   GFRNONAA 31* 06/01/2014 1110   GFRAA 36* 06/01/2014 1110    GFR Estimated Creatinine Clearance: 25.5 mL/min (by C-G formula based on Cr of 1.6). No results for input(s): LIPASE, AMYLASE in the last 72 hours. No results for input(s): CKTOTAL, CKMB, CKMBINDEX, TROPONINI in the last 72 hours. Invalid input(s): POCBNP No results for input(s): DDIMER in the last 72 hours.  Recent Labs  06/02/14 0609  HGBA1C 5.1    Recent Labs  06/02/14 0609  CHOL 155  HDL 66  LDLCALC 71  TRIG 91  CHOLHDL 2.3   No results for input(s): TSH, T4TOTAL, T3FREE, THYROIDAB in the last 72  hours.  Invalid input(s): FREET3 No results for input(s): VITAMINB12, FOLATE, FERRITIN, TIBC, IRON, RETICCTPCT in the last 72 hours. Coags:  Recent Labs  06/01/14 1110  INR 1.05   Microbiology: No results found for this or any previous visit (from the past 240 hour(s)).   BRIEF HOSPITAL COURSE:   Active Problems:    Acute DQQ:[id number] with a dysarthria, CT head negative for acute infarct, however MRI brain showed left corona radiata infarct.echo was negative for embolic source, carotid Doppler did not show any significant stenosis. A1c was at 5.1, LDLat 71. Current recommendations from neurology are to continue with aspirin 325 mg on discharge. To continue with statins on discharge as well. Further risk factor modification will need to be done in the outpatient setting. Patient will need to follow-up with Dr. Leonie Man in 1 month.By the time of discharge, speech significantly better and almost back to baseline.  Hypertension: controlled, continue with Norvasc, Coreg, Valsartan-HCTZ.  Hyperlipidemia:see above, continue Lipitor.  Restless leg syndrome: continue Requip, Neurontin increased to 300 mg daily at bedtime.  TODAY-DAY OF DISCHARGE:  Subjective:   Brenda Werner today has no headache,no chest abdominal pain,no new weakness tingling or numbness, feels much better wants to go home today.   Objective:   Blood pressure 117/68, pulse 74, temperature 98.6 F (37 C), temperature source Oral, resp. rate 18, height 5' 3.6" (1.615 m), weight 68.04 kg (150 lb), SpO2 96 %.  Intake/Output Summary (Last 24 hours) at 06/03/14 1155 Last data filed at 06/02/14 1453  Gross per 24 hour  Intake    120 ml  Output      0 ml  Net    120 ml   Filed Weights   06/02/14 0800  Weight: 68.04 kg (150 lb)    Exam Awake Alert, Oriented *3, No new F.N deficits, Normal affect [place].AT,PERRAL Supple Neck,No JVD, No cervical lymphadenopathy appriciated.  Symmetrical Chest wall movement, Good air  movement bilaterally, CTAB RRR,No Gallops,Rubs or new Murmurs, No Parasternal Heave +ve B.Sounds, Abd Soft, Non tender, No organomegaly appriciated, No rebound -guarding or rigidity. No Cyanosis, Clubbing or edema, No new Rash or bruise  DISCHARGE CONDITION: Stable  DISPOSITION: Home  DISCHARGE INSTRUCTIONS:    Activity:  As tolerated   Diet recommendation: Diabetic Diet Heart Healthy diet  Discharge Instructions    Ambulatory referral to Neurology    Complete by:  As directed   Stroke patient. Dr. Leonie Man prefers follow up in 1 month     Call MD for:  extreme fatigue    Complete by:  As directed      Call MD  for:  persistant dizziness or light-headedness    Complete by:  As directed      Diet - low sodium heart healthy    Complete by:  As directed      Increase activity slowly    Complete by:  As directed            Follow-up Information    Follow up with SETHI,PRAMOD, MD In 1 month.   Specialties:  Neurology, Radiology   Why:  Stroke Clinic, Office will call you with appointment date & time   Contact information:   [place] [address] [phone]       Follow up with Sherian Maroon, MD. Schedule an appointment as soon as possible for a visit in 1 week.   Specialty:  Family Medicine   Contact information:   [address] [address] [phone]       Total Time spent on discharge equals 45 minutes.  SignedOren Binet 06/03/2014 11:55 AM

## 2014-06-03 NOTE — Progress Notes (Signed)
Pt is being discharged home. Discharge instructions were given to patient and family 

## 2014-06-03 NOTE — Progress Notes (Signed)
STROKE TEAM PROGRESS NOTE   HISTORY Brenda Werner is an 80 y.o. female Who was taking part in rehabilitation at [facility] when staff noted a acute onset of dysarthria. In talking with patient , she noted she was having difficulty with her speech dating back to Saturday 05/30/2014. Patient was brought to ED as a Code stroke. On arrival she did show significant dysarthria. She had some complaints of decreased sensation on the left buttock to leg but stated that occurred one months. Ago. Patient was brought back to CT and initial head CT was negative. Patient was not a tPA candidate due to being out of window. she was last known well 05/30/2014, time known. Patient was not administered TPA secondary to delay in arrival. She was admitted for further evaluation and treatment.   SUBJECTIVE (INTERVAL HISTORY) Her daughter alone with friends are at the bedside.    OBJECTIVE Temp:  [97.9 F (36.6 C)-98.6 F (37 C)] 98.6 F (37 C) (11/18 0900) Pulse Rate:  [64-82] 74 (11/18 0900) Cardiac Rhythm:  [-] Normal sinus rhythm (11/18 0800) Resp:  [17-18] 18 (11/18 0900) BP: (98-131)/(46-79) 117/68 mmHg (11/18 0900) SpO2:  [94 %-98 %] 96 % (11/18 0900)  No results for input(s): GLUCAP in the last 168 hours.  Recent Labs Lab 06/01/14 1110 06/01/14 1129  NA 140 141  K 4.4 4.0  CL 104 105  CO2 24  --   GLUCOSE 100* 107*  BUN 23 23  CREATININE 1.51* 1.60*  CALCIUM 9.9  --     Recent Labs Lab 06/01/14 1110  AST 22  ALT 10  ALKPHOS 69  BILITOT 0.3  PROT 7.4  ALBUMIN 3.7    Recent Labs Lab 06/01/14 1110 06/01/14 1129  WBC 6.1  --   NEUTROABS 3.8  --   HGB 13.1 14.3  HCT 37.4 42.0  MCV 90.6  --   PLT 114*  --    No results for input(s): CKTOTAL, CKMB, CKMBINDEX, TROPONINI in the last 168 hours.  Recent Labs  06/01/14 1110  LABPROT 13.8  INR 1.05    Recent Labs  06/01/14 1225  COLORURINE YELLOW  LABSPEC 1.008  PHURINE 7.5  GLUCOSEU NEGATIVE  HGBUR NEGATIVE   BILIRUBINUR NEGATIVE  KETONESUR NEGATIVE  PROTEINUR NEGATIVE  UROBILINOGEN 0.2  NITRITE NEGATIVE  LEUKOCYTESUR NEGATIVE       Component Value Date/Time   CHOL 155 06/02/2014 0609   TRIG 91 06/02/2014 0609   HDL 66 06/02/2014 0609   CHOLHDL 2.3 06/02/2014 0609   VLDL 18 06/02/2014 0609   LDLCALC 71 06/02/2014 0609   Lab Results  Component Value Date   HGBA1C 5.1 06/02/2014      Component Value Date/Time   LABOPIA NONE DETECTED 06/01/2014 1225   COCAINSCRNUR NONE DETECTED 06/01/2014 1225   LABBENZ NONE DETECTED 06/01/2014 1225   AMPHETMU NONE DETECTED 06/01/2014 1225   THCU NONE DETECTED 06/01/2014 1225   LABBARB NONE DETECTED 06/01/2014 1225     Recent Labs Lab 06/01/14 1110  ETH <11    Dg Chest 2 View  06/01/2014   CLINICAL DATA:  Cerebral infarction, initial encounter  EXAM: CHEST  2 VIEW  COMPARISON:  06/1912  FINDINGS: Mild cardiac enlargement to moderate cardiac enlargement, mildly more prominent when compared to prior study. Uncoiling of the aorta as well as aortic arch calcification. Vascular pattern normal. No edema or consolidation.  IMPRESSION: No acute findings.  Mild to moderate cardiac enlargement.   Electronically Signed   By: Kyung Rudd  Rubner M.D.   On: 06/01/2014 16:46   Ct Head Wo Contrast  06/01/2014   CLINICAL DATA:  Code stroke. Slurred speech. Left-sided weakness for 1 month  EXAM: CT HEAD WITHOUT CONTRAST  TECHNIQUE: Contiguous axial images were obtained from the base of the skull through the vertex without intravenous contrast.  COMPARISON:  12/08/2012.  FINDINGS: There is patchy low attenuation within the periventricular and subcortical white matter. Chronica right basal ganglia lacunar infarct noted. Prominence of the sulci and ventricles noted.There is no evidence for acute brain infarct, hemorrhage or mass. No extra-axial fluid collection identified. There is partial opacification of the mastoid air cells and the left side of the frontal sinus.  Bilateral median antrectomies have been performed. The calvarium appears intact.  IMPRESSION: 1. No acute intracranial abnormalities. 2. Chronic small vessel ischemic disease and brain atrophy.   Electronically Signed   By: Kerby Moors M.D.   On: 06/01/2014 11:37   Mr Brain Wo Contrast  06/01/2014   CLINICAL DATA:  Left-sided weakness for 1 month. New aphasia. Stroke.  EXAM: MRI HEAD WITHOUT CONTRAST  MRA HEAD WITHOUT CONTRAST  TECHNIQUE: Multiplanar, multiecho pulse sequences of the brain and surrounding structures were obtained without intravenous contrast. Angiographic images of the head were obtained using MRA technique without contrast.  COMPARISON:  Head CT 06/01/2014  FINDINGS: MRI HEAD FINDINGS  Images are mildly degraded by motion artifact.  There is a 1.5 cm acute infarct in the left corona radiata. There is no evidence of intracranial hemorrhage, mass, midline shift, or extra-axial fluid collection. There is moderate cerebral atrophy. Patchy T2 hyperintensities throughout the subcortical and deep cerebral white matter are compatible with moderate chronic small vessel ischemic disease. Small, chronic infarcts are present in the pons extending into the left middle cerebellar peduncle and in the right basal ganglia and bilateral thalami.  Prior bilateral cataract extraction is noted. There is evidence of chronic right maxillary sinusitis. Small to moderate bilateral mastoid effusions are present. Distal right vertebral artery flow void is small and not well seen. Other major intracranial vascular flow voids are grossly preserved.  MRA HEAD FINDINGS  Images are mildly degraded by motion artifact. Visualized distal left vertebral artery is patent and dominant. Distal right vertebral artery appears hypoplastic, particularly its intracranial portion which is not well visualized, particularly distally. PICA origins are patent bilaterally. Focal contour irregularity of the proximal basilar artery is  suggestive of a small fenestration, although evaluation is partially limited by motion artifact through the inferior aspect of the abnormality. Basilar artery is otherwise patent and without stenosis. SCA origins are patent. Proximal PCAs are patent without stenosis. Bilateral PCA branch vessel irregularity is noted which may reflect a combination of atherosclerosis and motion artifact.  Visualized distal cervical internal carotid arteries are tortuous. Intracranial ICAs are patent to the carotid termini, with mild ectasia of the cavernous segments noted. M1 segments are patent without evidence of significant stenosis. MCA branch vessel evaluation is limited by motion with mild-to-moderate irregularity noted. There is mild-to-moderate diffuse bilateral ACA irregularity. No definite intracranial aneurysm is identified.  IMPRESSION: 1. Small, acute left corona radiata infarct. 2. Moderate chronic small vessel ischemic disease and chronic infarcts as above. 3. Mildly motion degraded MRA, limiting branch vessel evaluation. No evidence of major intracranial arterial occlusion or flow-limiting proximal stenosis.   Electronically Signed   By: Logan Bores   On: 06/01/2014 18:32   Mr Jodene Nam Head/brain Wo Cm  06/01/2014   CLINICAL DATA:  Left-sided weakness  for 1 month. New aphasia. Stroke.  EXAM: MRI HEAD WITHOUT CONTRAST  MRA HEAD WITHOUT CONTRAST  TECHNIQUE: Multiplanar, multiecho pulse sequences of the brain and surrounding structures were obtained without intravenous contrast. Angiographic images of the head were obtained using MRA technique without contrast.  COMPARISON:  Head CT 06/01/2014  FINDINGS: MRI HEAD FINDINGS  Images are mildly degraded by motion artifact.  There is a 1.5 cm acute infarct in the left corona radiata. There is no evidence of intracranial hemorrhage, mass, midline shift, or extra-axial fluid collection. There is moderate cerebral atrophy. Patchy T2 hyperintensities throughout the subcortical  and deep cerebral white matter are compatible with moderate chronic small vessel ischemic disease. Small, chronic infarcts are present in the pons extending into the left middle cerebellar peduncle and in the right basal ganglia and bilateral thalami.  Prior bilateral cataract extraction is noted. There is evidence of chronic right maxillary sinusitis. Small to moderate bilateral mastoid effusions are present. Distal right vertebral artery flow void is small and not well seen. Other major intracranial vascular flow voids are grossly preserved.  MRA HEAD FINDINGS  Images are mildly degraded by motion artifact. Visualized distal left vertebral artery is patent and dominant. Distal right vertebral artery appears hypoplastic, particularly its intracranial portion which is not well visualized, particularly distally. PICA origins are patent bilaterally. Focal contour irregularity of the proximal basilar artery is suggestive of a small fenestration, although evaluation is partially limited by motion artifact through the inferior aspect of the abnormality. Basilar artery is otherwise patent and without stenosis. SCA origins are patent. Proximal PCAs are patent without stenosis. Bilateral PCA branch vessel irregularity is noted which may reflect a combination of atherosclerosis and motion artifact.  Visualized distal cervical internal carotid arteries are tortuous. Intracranial ICAs are patent to the carotid termini, with mild ectasia of the cavernous segments noted. M1 segments are patent without evidence of significant stenosis. MCA branch vessel evaluation is limited by motion with mild-to-moderate irregularity noted. There is mild-to-moderate diffuse bilateral ACA irregularity. No definite intracranial aneurysm is identified.  IMPRESSION: 1. Small, acute left corona radiata infarct. 2. Moderate chronic small vessel ischemic disease and chronic infarcts as above. 3. Mildly motion degraded MRA, limiting branch vessel  evaluation. No evidence of major intracranial arterial occlusion or flow-limiting proximal stenosis.   Electronically Signed   By: Logan Bores   On: 06/01/2014 18:32   Carotid Doppler  There is 1-39% bilateral ICA stenosis. Vertebral artery flow is antegrade.    2D Echocardiogram  EF 60-65% with no source of embolus.    PHYSICAL EXAM Pleasant elderly african american lady not in distress.Awake alert. Afebrile. Head is nontraumatic. Neck is supple without bruit. Hearing is normal. Cardiac exam no murmur or gallop. Lungs are clear to auscultation. Distal pulses are well felt. Neurological Exam ; awake alert oriented 2. Diminished attention and recall. Speech is slightly hesitant and mildly dysarthric but can be understood with some difficulty. No aphasia or apraxia. Extraocular movements are full range without nystagmus. Fundi were not visualized. Vision acuity seems adequate. Mild right lower facial asymmetry. Jaw jerk is brisk.Tongue midline. Motor system exam reveals no upper or lower extremity drift. No focal weakness. Slightly diminished fine finger movements on the right. Sensation appears intact. Coordination is slow but accurate. Deep tendon reflexes are brisk bilaterally.Gait was not tested. Plantars are downgoing.   ASSESSMENT/PLAN Brenda Werner is a 80 y.o. female with history of hypertension, hyperlipidemia and unknown irregular heartbeat presenting with severe dysarthria.  She did not receive IV t-PA due to delay in arrival.   Stroke:  Dominant left corona radiata infarct secondary to small vessel disease source Old pontine, left middle cerebellar peduncle, right basal ganglia and bilateral thalamic infarcts. Severe small vessel disease  Resultant  Pseudobulbar speech given bilateral infarcts  (new and old), brisk jaw jerk, brisk reflexes. Speech is significantly improving  MRI  Left corona radiata infarct, small vessel disease  MRA  No significant stenosis  Carotid  Doppler  No significant stenosis  2D Echo   No source of embolus   HgbA1c 5.1  Lovenox 40 mg sq daily for VTE prophylaxis  Diet Heart thin liquids  recorded as takingaspirin 81 mg orally every day prior to admission, however, pt states she was not taking. now on aspirin 325 mg orally every day in hospital. Will continue at discharge.  Ongoing aggressive risk factor management  Therapy recommendations:  No PT or OT. OPSpeech therapy   Disposition:   home with outpatient speech therapy NO FURTHER STROKE WORKUP INDICATED Patient has a 10-15% risk of having another stroke over the next year, the highest risk is within 2 weeks of the most recent stroke/TIA (risk of having a stroke following a stroke or TIA is the same). Ongoing risk factor control by Primary Care Physician Stroke Service will sign off. Please call should any needs arise. Follow up with Dr. Antony Contras at [facility] in 1 month(s), order placed.  Hypertension  Home meds:   Norvasc, Coreg, valsartan-hydrochlorothiazide  Stable  Hyperlipidemia  Home meds:  lipitor 20, resumed in hospital  LDL 71, almost at goal of < 70  Continue statin at discharge  Other Stroke Risk Factors  Advanced age  Other Pertinent History  Restless Leg Syndrome. On Neurontin 200 mg at night. Iincreased to 300 mg q hs.    Hospital day # 2  Burnetta Sabin, MSN, APRN, ANVP-BC, AGPCNP-BC [facility] Pager: [phone] 06/03/2014 11:13 AM  I have personally examined this patient, reviewed notes, independently viewed imaging studies, participated in medical decision making and plan of care. I have made any additions or clarifications directly to the above note. Agree with note above. Stroke team will sign off. Call for questions.  Antony Contras, MD Medical Director [facility] Pager: [phone] 06/03/2014 3:41 PM   To contact Stroke Continuity provider, please refer to  [email or web address] After hours, contact General Neurology

## 2014-08-17 ENCOUNTER — Ambulatory Visit (INDEPENDENT_AMBULATORY_CARE_PROVIDER_SITE_OTHER): Payer: Medicare (Managed Care) | Admitting: Neurology

## 2014-08-17 ENCOUNTER — Encounter: Payer: Self-pay | Admitting: Neurology

## 2014-08-17 VITALS — BP 132/71 | HR 83 | Ht 63.0 in | Wt 177.8 lb

## 2014-08-17 DIAGNOSIS — R413 Other amnesia: Secondary | ICD-10-CM

## 2014-08-17 NOTE — Patient Instructions (Signed)
I had a long d/w patient with regards to her recent stroke, risk for recurrent strokes & TIAs, her stroke risk factors, need for aggressive risk factor modification , personally reviewed imaging studies and hospital stroke work up and answered questions. She has had worsening memory loss since her stroke hence will check dementia panel labs, EEG and CT head. Continue aspirin 325 mg orally every day  for secondary stroke prevention and maintain strict control of hypertension with blood pressure goal below 130/90, diabetes with hemoglobin A1c goal below 6.5% and lipids with LDL cholesterol goal below 100 mg/dL. Followup in the future with me in 3 months

## 2014-08-17 NOTE — Progress Notes (Signed)
[facility] [address] [place]. [place] [address] [phone]       OFFICE FOLLOW-UP NOTE  [name] Date of Birth:  01/14/1930 Medical Record Number:  [id number]   HPI: 34 year lady seen today for the first office follow-up visit following hospital admission for stroke on 06/01/14. She presented with sudden onset of dysarthria. CT scan of the head showed no acute abnormality but MRI scan showed an acute left coronary radiata infarct. There are moderate changes of chronic small vessel disease and remote edge chronic infarcts were noted in the pons, left middle cerebellar peduncle, right basal ganglia and bilateral thalami. Hemoglobin A1c was 5.1. Cholesterol 155, HDL 66 and LDL 71 mg percent. Carotid ultrasound did not show significant extracranial stenosis and transthoracic echo showed no cardiac source of embolism. Patient was started on aspirin 325 mg daily and medications for blood pressure and Lipitor was continued. She continues to have dysarthria but her speech is overall improved though dysarthria is noticeable off and on. She is now living with her daughter. She is able to walk with a cane. Her daughter helps her with bathing and putting on her TED hoses. She has noticed a gradual decline in her memory over the last several years. She is complaining of some pain in her arms and legs but does have a long-standing history of arthritis. She states her blood pressure is under good control and she is tolerating aspirin well without bleeding, bruising or other side effects  ROS:   14 system review of systems is positive for itching, moles, joint pain and swelling, memory loss, confusion, numbness, weakness, slurred speech, depression,  PMH:  Past Medical History  Diagnosis Date  . Anxiety   . Arthritis   . Cataract     had surgery  . Depression   . GERD (gastroesophageal reflux disease)   . Heart murmur   . Irregular heart beat     doesn't know type  .  Hyperlipidemia   . Hypertension   . Alzheimer's dementia     Social History:  History   Social History  . Marital Status: Legally Separated    Spouse Name: N/A    Number of Children: 8  . Years of Education: N/A   Occupational History  . retired    Social History Main Topics  . Smoking status: Never Smoker   . Smokeless tobacco: Never Used  . Alcohol Use: No  . Drug Use: No  . Sexual Activity: No   Other Topics Concern  . Not on file   Social History Narrative    Medications:   Current Outpatient Prescriptions on File Prior to Visit  Medication Sig Dispense Refill  . amLODipine (NORVASC) 10 MG tablet Take 10 mg by mouth daily.    Marland Kitchen aspirin 325 MG tablet Take 1 tablet (325 mg total) by mouth daily. 30 tablet 0  . atorvastatin (LIPITOR) 20 MG tablet Take 20 mg by mouth every evening.     Marland Kitchen buPROPion (WELLBUTRIN XL) 300 MG 24 hr tablet Take 300 mg by mouth daily.    . carvedilol (COREG) 25 MG tablet Take 25 mg by mouth 2 (two) times daily with a meal.    . Cholecalciferol 1000 UNITS tablet Take 1,000 Units by mouth daily.    Marland Kitchen docusate sodium (COLACE) 100 MG capsule Take 100 mg by mouth 2 (two) times daily.    Regino Schultze Bandages & Supports (T.E.D. KNEE LENGTH/L-REGULAR) MISC 1 each by Does  not apply route daily.    Marland Kitchen gabapentin (NEURONTIN) 100 MG capsule Take 3 capsules (300 mg total) by mouth at bedtime.    . hydroxypropyl methylcellulose / hypromellose (ISOPTO TEARS / GONIOVISC) 2.5 % ophthalmic solution Place 1 drop into both eyes See admin instructions. 1 drop each eye twice daily and as needed for dry eyes    . Menthol, Topical Analgesic, (BIOFREEZE EX) Apply 1 application topically 3 (three) times daily. 0.2%- 3.5% gel    . mirtazapine (REMERON) 15 MG tablet Take 15 mg by mouth at bedtime.    . Olopatadine HCl (PATADAY) 0.2 % SOLN Place 1 drop into both eyes daily as needed (for itching).    . potassium chloride SA (K-DUR,KLOR-CON) 20 MEQ tablet Take 20 mEq by mouth  daily.    Marland Kitchen rOPINIRole (REQUIP) 0.5 MG tablet Take 0.5 mg by mouth at bedtime.    . traMADol (ULTRAM) 50 MG tablet Take 50 mg by mouth every 6 (six) hours as needed for pain.    . valsartan-hydrochlorothiazide (DIOVAN-HCT) 160-12.5 MG per tablet Take 1 tablet by mouth daily.     No current facility-administered medications on file prior to visit.    Allergies:   Allergies  Allergen Reactions  . Lisinopril     Cough   . Latex Rash  . Penicillins Other (See Comments)    unknown    Physical Exam General: obese elderly lady, seated, in no evident distress Head: head normocephalic and atraumatic.  Neck: supple with no carotid or supraclavicular bruits Cardiovascular: regular rate and rhythm, no murmurs Musculoskeletal: no deformity.Left shoulder movement is limited due to pain  Skin:  no rash/petichiae Vascular:  Normal pulses all extremities Filed Vitals:   08/17/14 0923  BP: 132/71  Pulse: 83   Neurologic Exam Mental Status: Awake and fully alert. Oriented to place and time. Recent and remote memory diminished. Decreased recall 1/3. AFT 10 only.Clock drawing 1/4 only.MMSE 21/30.A. Attention span, concentration and fund of knowledge appropriate. Mood and affect appropriate. Mild dysarthria. Cranial Nerves: Fundoscopic exam not done   Pupils equal, briskly reactive to light. Extraocular movements full without nystagmus. Visual fields full to confrontation. Hearing intact. Facial sensation intact. Face, tongue, palate moves normally and symmetrically.  Motor: Normal bulk and tone. Normal strength in all tested extremity muscles. Diminished fine finger movements on the right. Orbits (right upper extremity. Sensory.: intact to touch ,pinprick .position and vibratory sensation.  Coordination: Rapid alternating movements normal in all extremities. Finger-to-nose and heel-to-shin performed accurately bilaterally. Gait and Station: Arises from chair without difficulty Favors left knee  shoulder movement is limited due to pain. Stance is normal. Gait demonstrates normal stride length and balance . Not able to heel, toe and tandem walk without difficulty.  Reflexes: 2+  Brisk and symmetric. Toes downgoing.       ASSESSMENT: 89 year lady with dominant left corona radiata infarct secondary to small vessel disease  In November 2015.Old pontine, left middle cerebellar peduncle, right basal ganglia and bilateral thalamic infarcts.Vascular risk factors of hypertension, hyperlipidemia, cerebrovascular disease        PLAN: I had a long d/w patient with regards to her recent stroke, risk for recurrent strokes & TIAs, her stroke risk factors, need for aggressive risk factor modification , personally reviewed imaging studies and hospital stroke work up and answered questions. She has had worsening memory loss since her stroke hence will check dementia panel labs, EEG and CT head. Continue aspirin 325 mg orally every day  for secondary stroke prevention and maintain strict control of hypertension with blood pressure goal below 130/90, diabetes with hemoglobin A1c goal below 6.5% and lipids with LDL cholesterol goal below 100 mg/dL. Followup in the future with me in 3 months    Note: This document was prepared with digital dictation and possible smart phrase technology. Any transcriptional errors that result from this process are unintentional

## 2014-08-18 LAB — DEMENTIA PANEL
Homocysteine: 12.8 umol/L (ref 0.0–15.0)
RPR Ser Ql: NONREACTIVE
TSH: 2.73 u[IU]/mL (ref 0.450–4.500)
Vitamin B-12: 627 pg/mL (ref 211–946)

## 2014-08-20 ENCOUNTER — Telehealth: Payer: Self-pay | Admitting: *Deleted

## 2014-08-20 ENCOUNTER — Other Ambulatory Visit: Payer: Self-pay | Admitting: Family Medicine

## 2014-08-20 DIAGNOSIS — I639 Cerebral infarction, unspecified: Secondary | ICD-10-CM

## 2014-08-20 NOTE — Telephone Encounter (Signed)
Sonya called about appt for CT.  She works out transportation for pt on appt.    I told her I would relay the information to [facility], in authorization to call her.  [phone].

## 2014-08-24 ENCOUNTER — Ambulatory Visit
Admission: RE | Admit: 2014-08-24 | Discharge: 2014-08-24 | Disposition: A | Discharge status: Home / Self Care | Payer: No Typology Code available for payment source | Source: Ambulatory Visit | Attending: Family Medicine | Admitting: Family Medicine

## 2014-08-24 DIAGNOSIS — I639 Cerebral infarction, unspecified: Secondary | ICD-10-CM

## 2014-08-25 ENCOUNTER — Ambulatory Visit (INDEPENDENT_AMBULATORY_CARE_PROVIDER_SITE_OTHER): Payer: Medicare (Managed Care) | Admitting: Neurology

## 2014-08-25 DIAGNOSIS — R413 Other amnesia: Secondary | ICD-10-CM

## 2014-08-25 NOTE — Procedures (Signed)
    History:  Brenda Werner is an 81 year old patient with a history of cerebrovascular disease, sustaining a left centrum semiovale stroke on 06/01/2014. The patient has reported some memory issues since that time. She is being evaluated for this issue.  This is a routine EEG. No skull defects are noted. Medications include Norvasc, aspirin, Lipitor, Wellbutrin, Coreg, vitamin D, Colace, gabapentin, Remeron, potassium supplementation, Requip, Ultram, and Diovan.   EEG classification: Normal awake  Description of the recording: The background rhythms of this recording consists of a fairly well modulated medium amplitude alpha rhythm of 8 Hz that is reactive to eye opening and closure. As the record progresses, the patient appears to remain in the waking state throughout the recording. Photic stimulation was performed, resulting in a bilateral and symmetric photic driving response. Hyperventilation was not performed. At no time during the recording does there appear to be evidence of spike or spike wave discharges or evidence of focal slowing. EKG monitor shows no evidence of cardiac rhythm abnormalities with a heart rate of 60.  Impression: This is a normal EEG recording in the waking state. No evidence of ictal or interictal discharges are seen.

## 2014-10-13 IMAGING — CR DG HIP COMPLETE 2+V*R*
3 series · 3 of 3 positions shown · non-contrast
Comparison: None.

CLINICAL DATA: Right hip pain.

RIGHT HIP - COMPLETE 2+ VIEW

[t pelvis a.p.]
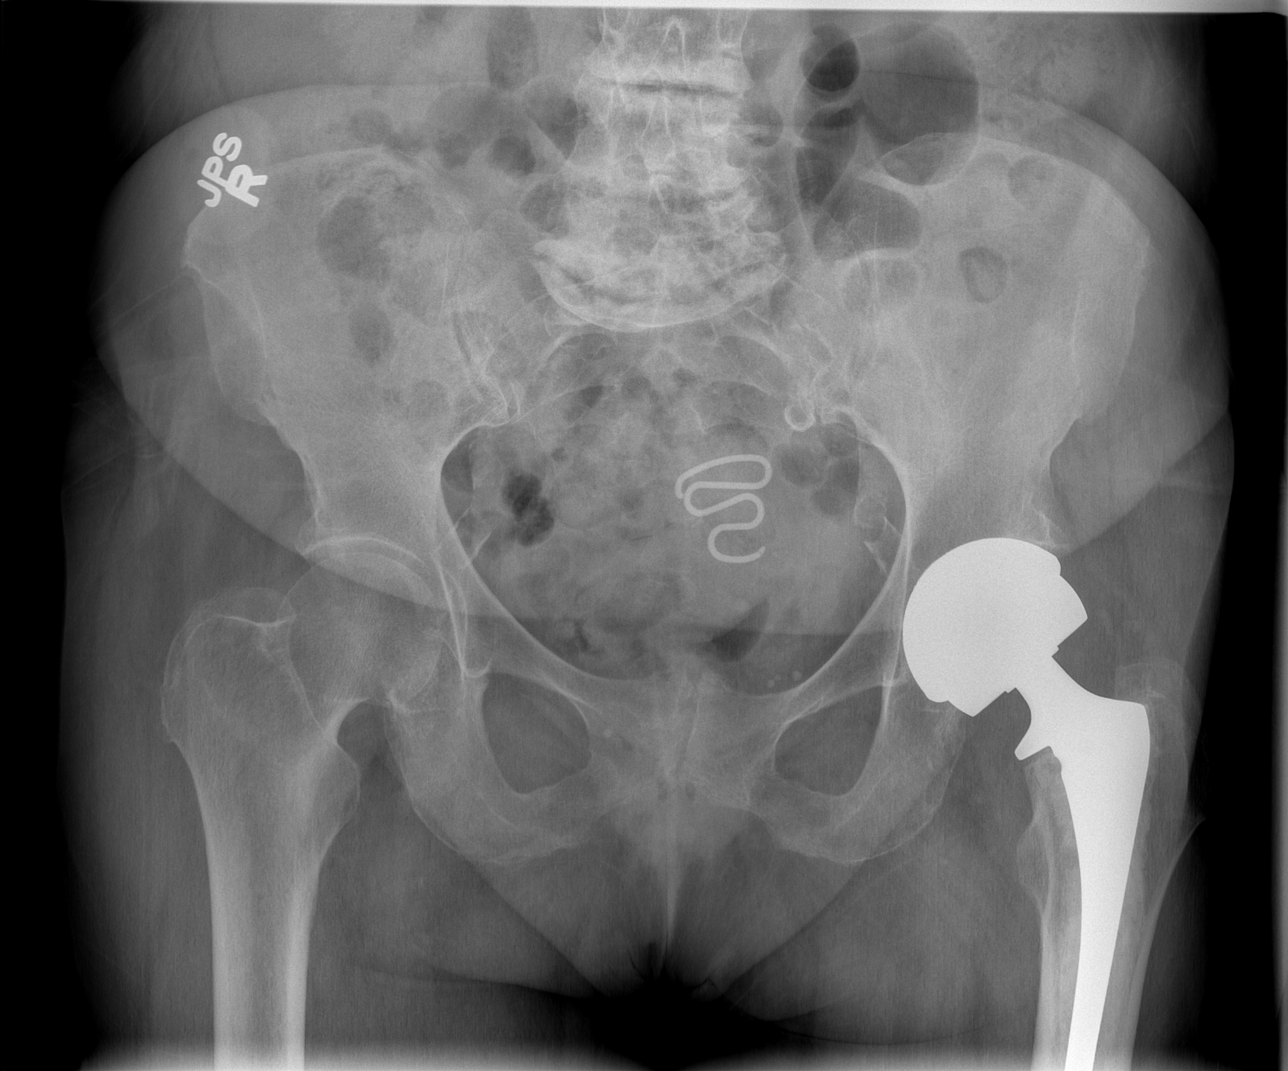

[t hip ap right]
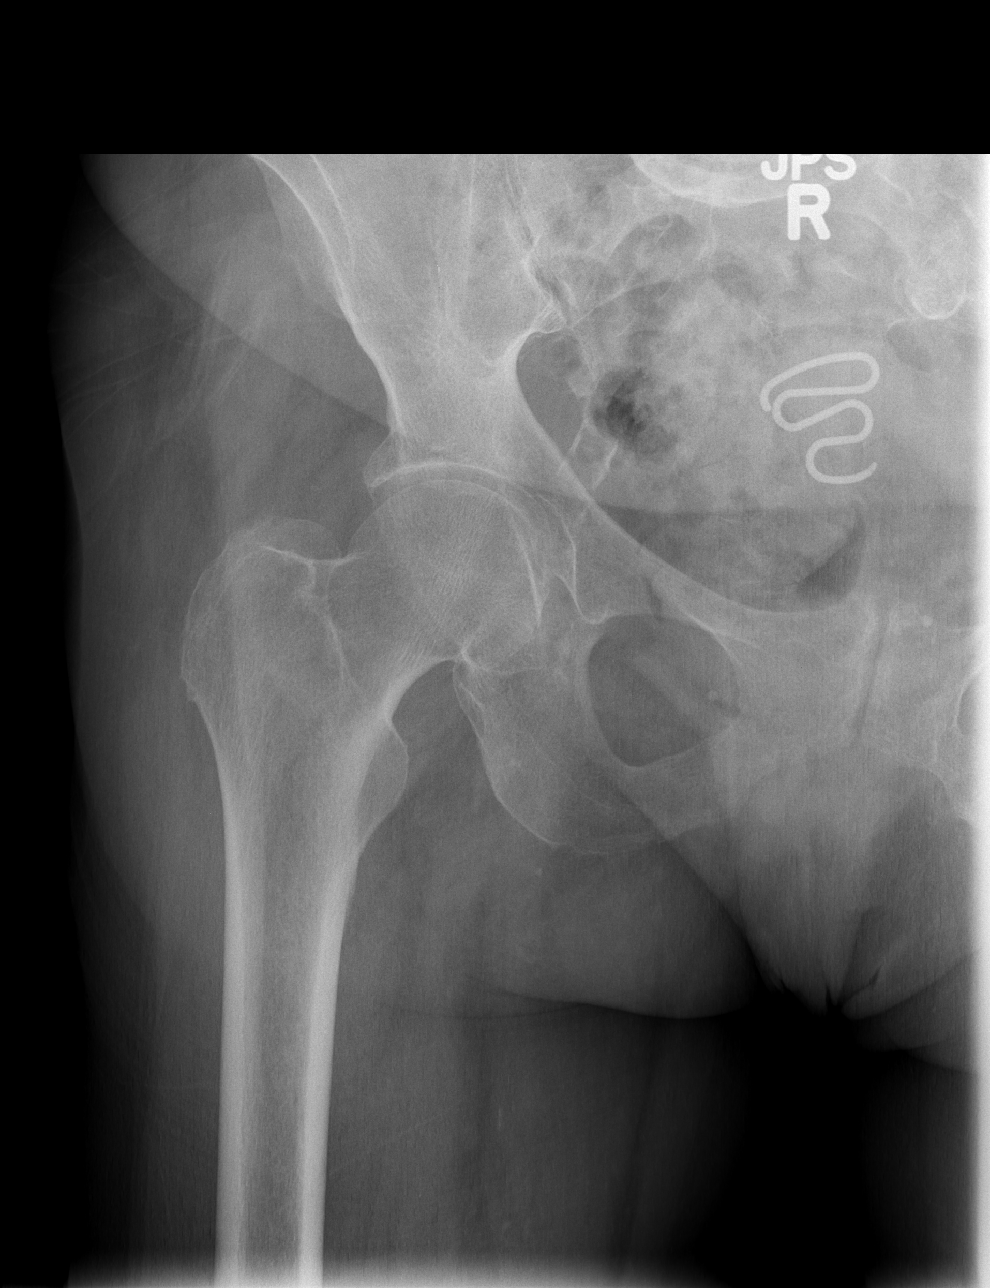

[t hip frog leg right]
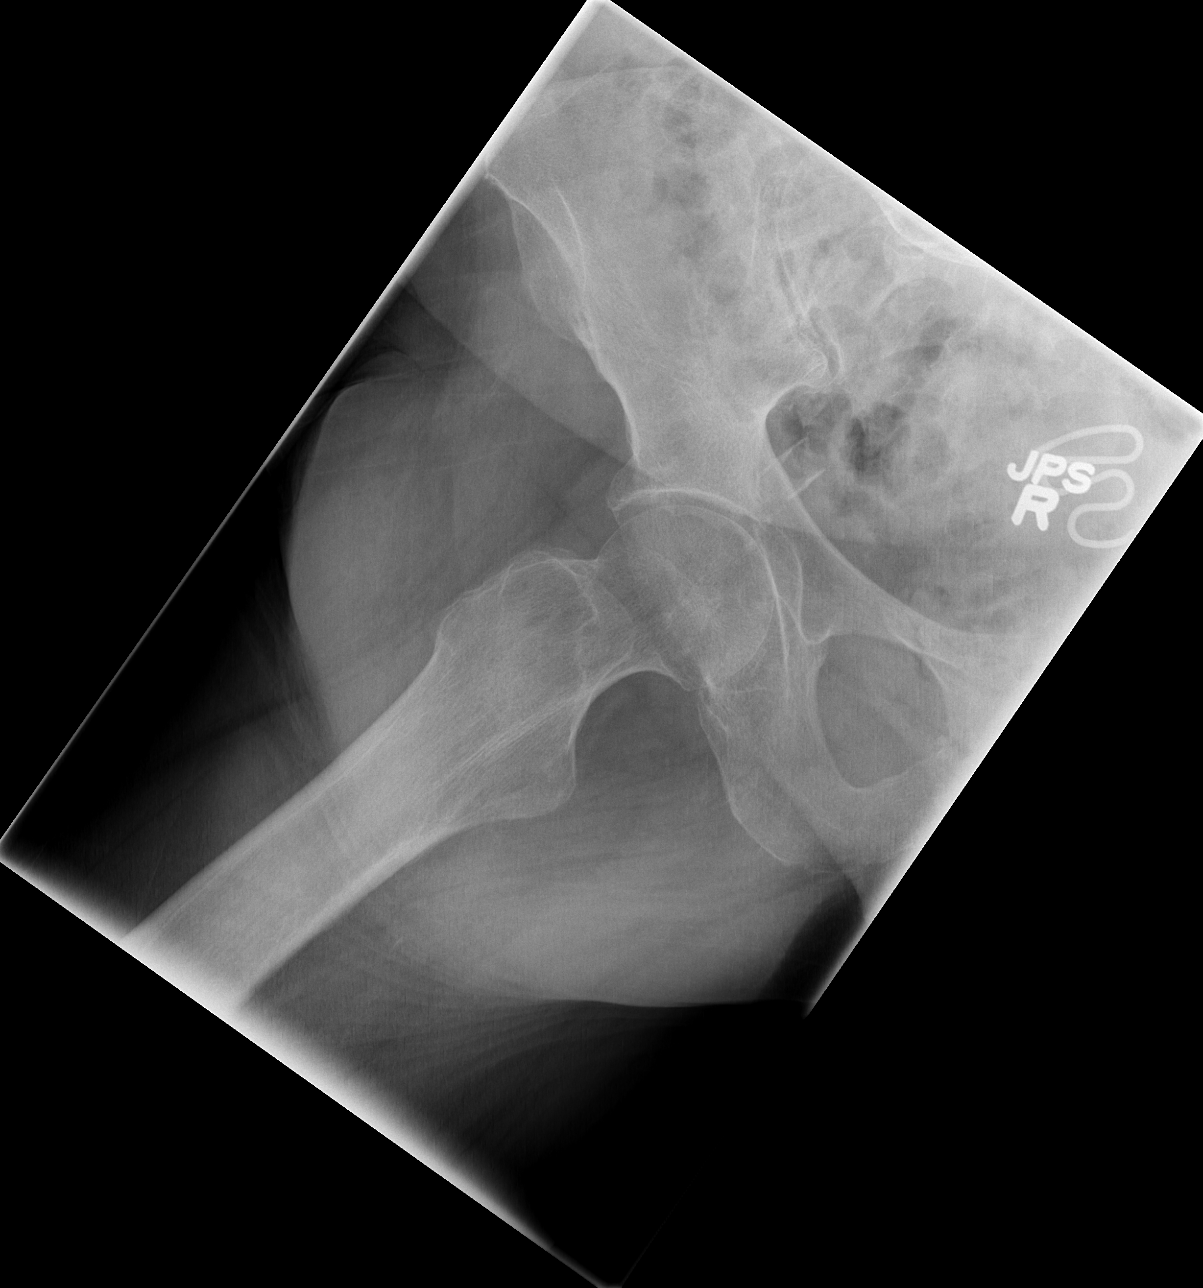

[3 of 3 positions shown; findings below may reference images not displayed]

FINDINGS: Left hip arthroplasty.  Right hip joint space is
maintained.  No significant degenerative changes in the right hip.
Vascular calcifications.  Intrauterine contraceptive device is seen
in the central anatomic pelvis.  Degenerative changes are
incidentally imaged in the lower lumbosacral spine.
IMPRESSION: No findings to explain the patient's right hip pain, other than
degenerative disc disease.

## 2014-11-26 ENCOUNTER — Ambulatory Visit: Payer: Medicare Other | Admitting: Neurology

## 2014-11-27 ENCOUNTER — Encounter: Payer: Self-pay | Admitting: Neurology

## 2014-12-01 ENCOUNTER — Telehealth: Payer: Self-pay | Admitting: Neurology

## 2014-12-01 NOTE — Telephone Encounter (Signed)
Returned call from patient who states Darrelyn Hillock, RN at [facility] told her "she didn't need to come for this FU." She states "basically they have to send me to a doctor."  Informed Ms Kalp this caller would talk with Darrelyn Hillock.  She verbalized understanding. 2:45 pm  Spoke with Darrelyn Hillock, RN, [facility]. She states that patient's PCP, Dr Jerrell Belfast told patient that at this time he will manage her care, and she does not need to see Dr Leonie Man for FU. She further states that [facility] "should have cancelled that appointment".  Informed Kennyth Lose this caller will inform Dr Leonie Man. She verbalized understanding.

## 2014-12-01 NOTE — Telephone Encounter (Signed)
Patient called and stated she received a letter stating she had missed her appt on 5/12. She did not realize that she had an appt. She showed the letter to the people at [facility] and the pt says that they informed her she did not need to r/s. The pt would like to speak with the nurse regarding this issue, she does not understand why she needs to come back and see Dr. Leonie Man again. Please call and advise.

## 2014-12-09 NOTE — Telephone Encounter (Signed)
Okay with me 

## 2015-03-29 ENCOUNTER — Other Ambulatory Visit: Payer: Self-pay | Admitting: Family Medicine

## 2015-03-29 ENCOUNTER — Ambulatory Visit
Admission: RE | Admit: 2015-03-29 | Discharge: 2015-03-29 | Disposition: A | Discharge status: Home / Self Care | Payer: No Typology Code available for payment source | Source: Ambulatory Visit | Attending: Family Medicine | Admitting: Family Medicine

## 2015-03-29 DIAGNOSIS — M25552 Pain in left hip: Secondary | ICD-10-CM

## 2017-04-18 ENCOUNTER — Other Ambulatory Visit: Payer: Self-pay | Admitting: Nurse Practitioner

## 2017-04-18 ENCOUNTER — Ambulatory Visit
Admission: RE | Admit: 2017-04-18 | Discharge: 2017-04-18 | Disposition: A | Discharge status: Home / Self Care | Payer: No Typology Code available for payment source | Source: Ambulatory Visit | Attending: Nurse Practitioner | Admitting: Nurse Practitioner

## 2017-04-18 DIAGNOSIS — M79672 Pain in left foot: Secondary | ICD-10-CM

## 2017-04-18 DIAGNOSIS — M158 Other polyosteoarthritis: Secondary | ICD-10-CM

## 2017-04-18 DIAGNOSIS — M25572 Pain in left ankle and joints of left foot: Secondary | ICD-10-CM

## 2022-07-17 DEATH — deceased
